# Patient Record
Sex: Female | Born: 1985 | Race: White | Hispanic: No | Marital: Single | Smoking: Never smoker
Health system: Southern US, Community
[De-identification: ages and names within clinical notes are randomized; demographics above are authoritative.]

## PROBLEM LIST (undated history)

## (undated) DIAGNOSIS — E119 Type 2 diabetes mellitus without complications: Secondary | ICD-10-CM

## (undated) DIAGNOSIS — K759 Inflammatory liver disease, unspecified: Secondary | ICD-10-CM

---

## 2015-11-23 ENCOUNTER — Ambulatory Visit (INDEPENDENT_AMBULATORY_CARE_PROVIDER_SITE_OTHER): Payer: BLUE CROSS/BLUE SHIELD | Admitting: Family Medicine

## 2015-11-23 VITALS — BP 108/88 | HR 104 | Temp 98.6°F | Resp 16 | Ht 71.0 in | Wt 156.6 lb

## 2015-11-23 DIAGNOSIS — J069 Acute upper respiratory infection, unspecified: Secondary | ICD-10-CM

## 2015-11-23 DIAGNOSIS — R059 Cough, unspecified: Secondary | ICD-10-CM

## 2015-11-23 DIAGNOSIS — R05 Cough: Secondary | ICD-10-CM | POA: Diagnosis not present

## 2015-11-23 MED ORDER — HYDROCODONE-HOMATROPINE 5-1.5 MG/5ML PO SYRP: ORAL_SOLUTION | ORAL | Status: DC

## 2015-11-23 NOTE — Patient Instructions (Signed)
Saline nasal spray as needed, over the counter mucinex or mucinex DM for cough during day, hydrocodone cough syrup at night if needed.  Fluids, and rest as needed.   Return to the clinic or go to the nearest emergency room if any of your symptoms worsen or new symptoms occur.   Upper Respiratory Infection, Adult Most upper respiratory infections (URIs) are a viral infection of the air passages leading to the lungs. A URI affects the nose, throat, and upper air passages. The most common type of URI is nasopharyngitis and is typically referred to as "the common cold." URIs run their course and usually go away on their own. Most of the time, a URI does not require medical attention, but sometimes a bacterial infection in the upper airways can follow a viral infection. This is called a secondary infection. Sinus and middle ear infections are common types of secondary upper respiratory infections. Bacterial pneumonia can also complicate a URI. A URI can worsen asthma and chronic obstructive pulmonary disease (COPD). Sometimes, these complications can require emergency medical care and may be life threatening.  CAUSES Almost all URIs are caused by viruses. A virus is a type of germ and can spread from one person to another.  RISKS FACTORS You may be at risk for a URI if:   You smoke.   You have chronic heart or lung disease.  You have a weakened defense (immune) system.   You are very young or very old.   You have nasal allergies or asthma.  You work in crowded or poorly ventilated areas.  You work in health care facilities or schools. SIGNS AND SYMPTOMS  Symptoms typically develop 2-3 days after you come in contact with a cold virus. Most viral URIs last 7-10 days. However, viral URIs from the influenza virus (flu virus) can last 14-18 days and are typically more severe. Symptoms may include:   Runny or stuffy (congested) nose.   Sneezing.   Cough.   Sore throat.   Headache.    Fatigue.   Fever.   Loss of appetite.   Pain in your forehead, behind your eyes, and over your cheekbones (sinus pain).  Muscle aches.  DIAGNOSIS  Your health care provider may diagnose a URI by:  Physical exam.  Tests to check that your symptoms are not due to another condition such as:  Strep throat.  Sinusitis.  Pneumonia.  Asthma. TREATMENT  A URI goes away on its own with time. It cannot be cured with medicines, but medicines may be prescribed or recommended to relieve symptoms. Medicines may help:  Reduce your fever.  Reduce your cough.  Relieve nasal congestion. HOME CARE INSTRUCTIONS   Take medicines only as directed by your health care provider.   Gargle warm saltwater or take cough drops to comfort your throat as directed by your health care provider.  Use a warm mist humidifier or inhale steam from a shower to increase air moisture. This may make it easier to breathe.  Drink enough fluid to keep your urine clear or pale yellow.   Eat soups and other clear broths and maintain good nutrition.   Rest as needed.   Return to work when your temperature has returned to normal or as your health care provider advises. You may need to stay home longer to avoid infecting others. You can also use a face mask and careful hand washing to prevent spread of the virus.  Increase the usage of your inhaler if you have  asthma.   Do not use any tobacco products, including cigarettes, chewing tobacco, or electronic cigarettes. If you need help quitting, ask your health care provider. PREVENTION  The best way to protect yourself from getting a cold is to practice good hygiene.   Avoid oral or hand contact with people with cold symptoms.   Wash your hands often if contact occurs.  There is no clear evidence that vitamin C, vitamin E, echinacea, or exercise reduces the chance of developing a cold. However, it is always recommended to get plenty of rest,  exercise, and practice good nutrition.  SEEK MEDICAL CARE IF:   You are getting worse rather than better.   Your symptoms are not controlled by medicine.   You have chills.  You have worsening shortness of breath.  You have brown or red mucus.  You have yellow or brown nasal discharge.  You have pain in your face, especially when you bend forward.  You have a fever.  You have swollen neck glands.  You have pain while swallowing.  You have white areas in the back of your throat. SEEK IMMEDIATE MEDICAL CARE IF:   You have severe or persistent:  Headache.  Ear pain.  Sinus pain.  Chest pain.  You have chronic lung disease and any of the following:  Wheezing.  Prolonged cough.  Coughing up blood.  A change in your usual mucus.  You have a stiff neck.  You have changes in your:  Vision.  Hearing.  Thinking.  Mood. MAKE SURE YOU:   Understand these instructions.  Will watch your condition.  Will get help right away if you are not doing well or get worse.   This information is not intended to replace advice given to you by your health care provider. Make sure you discuss any questions you have with your health care provider.   Document Released: 04/25/2001 Document Revised: 03/16/2015 Document Reviewed: 02/04/2014 Elsevier Interactive Patient Education 2016 Elsevier Inc.  Cough, Adult Coughing is a reflex that clears your throat and your airways. Coughing helps to heal and protect your lungs. It is normal to cough occasionally, but a cough that happens with other symptoms or lasts a long time may be a sign of a condition that needs treatment. A cough may last only 2-3 weeks (acute), or it may last longer than 8 weeks (chronic). CAUSES Coughing is commonly caused by:  Breathing in substances that irritate your lungs.  A viral or bacterial respiratory infection.  Allergies.  Asthma.  Postnasal drip.  Smoking.  Acid backing up from the  stomach into the esophagus (gastroesophageal reflux).  Certain medicines.  Chronic lung problems, including COPD (or rarely, lung cancer).  Other medical conditions such as heart failure. HOME CARE INSTRUCTIONS  Pay attention to any changes in your symptoms. Take these actions to help with your discomfort:  Take medicines only as told by your health care provider.  If you were prescribed an antibiotic medicine, take it as told by your health care provider. Do not stop taking the antibiotic even if you start to feel better.  Talk with your health care provider before you take a cough suppressant medicine.  Drink enough fluid to keep your urine clear or pale yellow.  If the air is dry, use a cold steam vaporizer or humidifier in your bedroom or your home to help loosen secretions.  Avoid anything that causes you to cough at work or at home.  If your cough is worse at  night, try sleeping in a semi-upright position.  Avoid cigarette smoke. If you smoke, quit smoking. If you need help quitting, ask your health care provider.  Avoid caffeine.  Avoid alcohol.  Rest as needed. SEEK MEDICAL CARE IF:   You have new symptoms.  You cough up pus.  Your cough does not get better after 2-3 weeks, or your cough gets worse.  You cannot control your cough with suppressant medicines and you are losing sleep.  You develop pain that is getting worse or pain that is not controlled with pain medicines.  You have a fever.  You have unexplained weight loss.  You have night sweats. SEEK IMMEDIATE MEDICAL CARE IF:  You cough up blood.  You have difficulty breathing.  Your heartbeat is very fast.   This information is not intended to replace advice given to you by your health care provider. Make sure you discuss any questions you have with your health care provider.   Document Released: 04/28/2011 Document Revised: 07/21/2015 Document Reviewed: 01/06/2015 Elsevier Interactive Patient  Education [employer]

## 2015-11-23 NOTE — Progress Notes (Signed)
Subjective:  By signing my name below, I, Linda [name], attest that this documentation has been prepared under the direction and in the presence of Meredith Staggers, MD. Electronically Signed: Stann [name], Scribe. 11/23/2015 , 12:35 PM .  Patient was seen in Room 11 .   Patient ID: Linda [name], female    DOB: 1986-10-02, 30 y.o.   MRN: [id number] Chief Complaint  Patient presents with  . Cough    x 5 days   . Generalized Body Aches   HPI Linda [name] is a 30 y.o. female Pt complains of cough that started about 5 days ago with general myalgia. She states that it started with dry cough leading to sore throat, congestion with myalgia and feeling lethargic. She's taken tylenol cold & flu, dayquil, and nyquil without any relief. She also mentions having a low grade fever (tmax 99.8) 2 days ago, but this has resolved. She notes that the coughs are keeping her up at night. She has some shortness of breath with activity. She denies wheezing, and chest pain.   Her boyfriend has some nasal congestion. She also has sick contact while at work (at [employer]).  She also works as a Leisure centre manager at night.   There are no active problems to display for this patient.  History reviewed. No pertinent past medical history. History reviewed. No pertinent past surgical history. No Known Allergies Prior to Admission medications   Medication Sig Start Date End Date Taking? Authorizing Provider  Norgestimate-Ethinyl Estradiol Triphasic (TRI-LO-SPRINTEC) 0.18/0.215/0.25 MG-25 MCG tab Take 1 tablet by mouth daily.   Yes Historical Provider, MD   Social History   Social History  . Marital Status: Single    Spouse Name: N/A  . Number of Children: N/A  . Years of Education: N/A   Occupational History  . Not on file.   Social History Main Topics  . Smoking status: Never Smoker   . Smokeless tobacco: Not on file  . Alcohol Use: 0.0 oz/week    0 Standard drinks or equivalent per week  . Drug Use: No    . Sexual Activity: Not on file   Other Topics Concern  . Not on file   Social History Narrative  . No narrative on file   Review of Systems  Constitutional: Positive for fatigue. Negative for fever and chills.  HENT: Positive for congestion and sore throat. Negative for rhinorrhea and sinus pressure.   Respiratory: Positive for cough and shortness of breath (with activity). Negative for wheezing.   Cardiovascular: Negative for chest pain.  Gastrointestinal: Negative for nausea and vomiting.  Musculoskeletal: Positive for myalgias (general).      Objective:   Physical Exam  Constitutional: She is oriented to person, place, and time. She appears well-developed and well-nourished. No distress.  HENT:  Head: Normocephalic and atraumatic.  Right Ear: Hearing, tympanic membrane, external ear and ear canal normal.  Left Ear: Hearing, tympanic membrane, external ear and ear canal normal.  Nose: Nose normal. Right sinus exhibits no maxillary sinus tenderness and no frontal sinus tenderness. Left sinus exhibits no maxillary sinus tenderness and no frontal sinus tenderness.  Mouth/Throat: Oropharynx is clear and moist. No oropharyngeal exudate.  Eyes: Conjunctivae and EOM are normal. Pupils are equal, round, and reactive to light.  Cardiovascular: Normal rate, regular rhythm, normal heart sounds and intact distal pulses.   No murmur heard. Pulmonary/Chest: Effort normal and breath sounds normal. No respiratory distress. She has no wheezes. She has no rhonchi.  Lymphadenopathy:    She has no cervical adenopathy.  Neurological: She is alert and oriented to person, place, and time.  Skin: Skin is warm and dry. No rash noted.  Psychiatric: She has a normal mood and affect. Her behavior is normal.  Vitals reviewed.   Filed Vitals:   11/23/15 1201  BP: 108/88  Pulse: 104  Temp: 98.6 F (37 C)  TempSrc: Oral  Resp: 16  Height: 5\' 11"  (1.803 m)  Weight: 156 lb 9.6 oz (71.033 kg)  SpO2:  98%      Assessment & Plan:   ISLAH EVE is a 30 y.o. female Cough - Plan: HYDROcodone-homatropine (HYCODAN) 5-1.5 MG/5ML syrup  Acute upper respiratory infection  -suspected viral URI.   -sx care discussed, mucinex during day, hycodan at night if needed - SED.   -note for work given, RTC precautions.   Meds ordered this encounter  Medications  . Norgestimate-Ethinyl Estradiol Triphasic (TRI-LO-SPRINTEC) 0.18/0.215/0.25 MG-25 MCG tab    Sig: Take 1 tablet by mouth daily.  Marland Kitchen HYDROcodone-homatropine (HYCODAN) 5-1.5 MG/5ML syrup    Sig: 1m by mouth a bedtime as needed for cough.    Dispense:  120 mL    Refill:  0   Patient Instructions  Saline nasal spray as needed, over the counter mucinex or mucinex DM for cough during day, hydrocodone cough syrup at night if needed.  Fluids, and rest as needed.   Return to the clinic or go to the nearest emergency room if any of your symptoms worsen or new symptoms occur.   Upper Respiratory Infection, Adult Most upper respiratory infections (URIs) are a viral infection of the air passages leading to the lungs. A URI affects the nose, throat, and upper air passages. The most common type of URI is nasopharyngitis and is typically referred to as "the common cold." URIs run their course and usually go away on their own. Most of the time, a URI does not require medical attention, but sometimes a bacterial infection in the upper airways can follow a viral infection. This is called a secondary infection. Sinus and middle ear infections are common types of secondary upper respiratory infections. Bacterial pneumonia can also complicate a URI. A URI can worsen asthma and chronic obstructive pulmonary disease (COPD). Sometimes, these complications can require emergency medical care and may be life threatening.  CAUSES Almost all URIs are caused by viruses. A virus is a type of germ and can spread from one person to another.  RISKS FACTORS You may be at  risk for a URI if:   You smoke.   You have chronic heart or lung disease.  You have a weakened defense (immune) system.   You are very young or very old.   You have nasal allergies or asthma.  You work in crowded or poorly ventilated areas.  You work in health care facilities or schools. SIGNS AND SYMPTOMS  Symptoms typically develop 2-3 days after you come in contact with a cold virus. Most viral URIs last 7-10 days. However, viral URIs from the influenza virus (flu virus) can last 14-18 days and are typically more severe. Symptoms may include:   Runny or stuffy (congested) nose.   Sneezing.   Cough.   Sore throat.   Headache.   Fatigue.   Fever.   Loss of appetite.   Pain in your forehead, behind your eyes, and over your cheekbones (sinus pain).  Muscle aches.  DIAGNOSIS  Your health care provider may diagnose a  URI by:  Physical exam.  Tests to check that your symptoms are not due to another condition such as:  Strep throat.  Sinusitis.  Pneumonia.  Asthma. TREATMENT  A URI goes away on its own with time. It cannot be cured with medicines, but medicines may be prescribed or recommended to relieve symptoms. Medicines may help:  Reduce your fever.  Reduce your cough.  Relieve nasal congestion. HOME CARE INSTRUCTIONS   Take medicines only as directed by your health care provider.   Gargle warm saltwater or take cough drops to comfort your throat as directed by your health care provider.  Use a warm mist humidifier or inhale steam from a shower to increase air moisture. This may make it easier to breathe.  Drink enough fluid to keep your urine clear or pale yellow.   Eat soups and other clear broths and maintain good nutrition.   Rest as needed.   Return to work when your temperature has returned to normal or as your health care provider advises. You may need to stay home longer to avoid infecting others. You can also use a face  mask and careful hand washing to prevent spread of the virus.  Increase the usage of your inhaler if you have asthma.   Do not use any tobacco products, including cigarettes, chewing tobacco, or electronic cigarettes. If you need help quitting, ask your health care provider. PREVENTION  The best way to protect yourself from getting a cold is to practice good hygiene.   Avoid oral or hand contact with people with cold symptoms.   Wash your hands often if contact occurs.  There is no clear evidence that vitamin C, vitamin E, echinacea, or exercise reduces the chance of developing a cold. However, it is always recommended to get plenty of rest, exercise, and practice good nutrition.  SEEK MEDICAL CARE IF:   You are getting worse rather than better.   Your symptoms are not controlled by medicine.   You have chills.  You have worsening shortness of breath.  You have brown or red mucus.  You have yellow or brown nasal discharge.  You have pain in your face, especially when you bend forward.  You have a fever.  You have swollen neck glands.  You have pain while swallowing.  You have white areas in the back of your throat. SEEK IMMEDIATE MEDICAL CARE IF:   You have severe or persistent:  Headache.  Ear pain.  Sinus pain.  Chest pain.  You have chronic lung disease and any of the following:  Wheezing.  Prolonged cough.  Coughing up blood.  A change in your usual mucus.  You have a stiff neck.  You have changes in your:  Vision.  Hearing.  Thinking.  Mood. MAKE SURE YOU:   Understand these instructions.  Will watch your condition.  Will get help right away if you are not doing well or get worse.   This information is not intended to replace advice given to you by your health care provider. Make sure you discuss any questions you have with your health care provider.   Document Released: 04/25/2001 Document Revised: 03/16/2015 Document Reviewed:  02/04/2014 Elsevier Interactive Patient Education 2016 Else[employer] Inc.  Cough, Adult Coughing is a reflex that clears your throat and your airways. Coughing helps to heal and protect your lungs. It is normal to cough occasionally, but a cough that happens with other symptoms or lasts a long time may be a sign of  a condition that needs treatment. A cough may last only 2-3 weeks (acute), or it may last longer than 8 weeks (chronic). CAUSES Coughing is commonly caused by:  Breathing in substances that irritate your lungs.  A viral or bacterial respiratory infection.  Allergies.  Asthma.  Postnasal drip.  Smoking.  Acid backing up from the stomach into the esophagus (gastroesophageal reflux).  Certain medicines.  Chronic lung problems, including COPD (or rarely, lung cancer).  Other medical conditions such as heart failure. HOME CARE INSTRUCTIONS  Pay attention to any changes in your symptoms. Take these actions to help with your discomfort:  Take medicines only as told by your health care provider.  If you were prescribed an antibiotic medicine, take it as told by your health care provider. Do not stop taking the antibiotic even if you start to feel better.  Talk with your health care provider before you take a cough suppressant medicine.  Drink enough fluid to keep your urine clear or pale yellow.  If the air is dry, use a cold steam vaporizer or humidifier in your bedroom or your home to help loosen secretions.  Avoid anything that causes you to cough at work or at home.  If your cough is worse at night, try sleeping in a semi-upright position.  Avoid cigarette smoke. If you smoke, quit smoking. If you need help quitting, ask your health care provider.  Avoid caffeine.  Avoid alcohol.  Rest as needed. SEEK MEDICAL CARE IF:   You have new symptoms.  You cough up pus.  Your cough does not get better after 2-3 weeks, or your cough gets worse.  You cannot control  your cough with suppressant medicines and you are losing sleep.  You develop pain that is getting worse or pain that is not controlled with pain medicines.  You have a fever.  You have unexplained weight loss.  You have night sweats. SEEK IMMEDIATE MEDICAL CARE IF:  You cough up blood.  You have difficulty breathing.  Your heartbeat is very fast.   This information is not intended to replace advice given to you by your health care provider. Make sure you discuss any questions you have with your health care provider.   Document Released: 04/28/2011 Document Revised: 07/21/2015 Document Reviewed: 01/06/2015 Elsevier Interactive Patient Education [employer]     I personally performed the services described in this documentation, which was scribed in my presence. The recorded information has been reviewed and considered, and addended by me as needed.

## 2015-12-10 ENCOUNTER — Ambulatory Visit (INDEPENDENT_AMBULATORY_CARE_PROVIDER_SITE_OTHER): Payer: BLUE CROSS/BLUE SHIELD | Admitting: Family Medicine

## 2015-12-10 VITALS — BP 98/62 | HR 77 | Temp 98.4°F | Resp 16 | Ht 70.0 in | Wt 155.4 lb

## 2015-12-10 DIAGNOSIS — H109 Unspecified conjunctivitis: Secondary | ICD-10-CM

## 2015-12-10 MED ORDER — POLYMYXIN B-TRIMETHOPRIM 10000-0.1 UNIT/ML-% OP SOLN: 1.0000 [drp] | OPHTHALMIC | Status: DC

## 2015-12-10 NOTE — Progress Notes (Signed)
[facility] [address], [place] [address] [phone]  Date:  12/10/2015   Name:  TARSHA [name]   DOB:  05-23-1986   MRN:  [id number]  PCP:  No PCP Per Patient    Chief Complaint: Eye Pain   History of Present Illness:  Linda [name] is a 30 y.o. very pleasant female patient who presents with the following:  Established pt here today with complaint of a right eye problems. She noted onset of sx yesterday- her eye was red and bloodshot. Overnight it got worse.  The eye is not painful.  It is "uncomfortable" but not painful. It was gooey this am.   Her vision is ok excpet for matter in the eye.   She does not wear contacts. She does use glasses for driving She is generally in good health.   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   NKDA.    There are no active problems to display for this patient.   History reviewed. No pertinent past medical history.  History reviewed. No pertinent past surgical history.  Social History  Substance Use Topics  . Smoking status: Never Smoker   . Smokeless tobacco: None  . Alcohol Use: 0.0 oz/week    0 Standard drinks or equivalent per week    Family History  Problem Relation Age of Onset  . Diabetes Sister     No Known Allergies  Medication list has been reviewed and updated.  Current Outpatient Prescriptions on File Prior to Visit  Medication Sig Dispense Refill  . Norgestimate-Ethinyl Estradiol Triphasic (TRI-LO-SPRINTEC) 0.18/0.215/0.25 MG-25 MCG tab Take 1 tablet by mouth daily.     No current facility-administered medications on file prior to visit.    Review of Systems:  As per HPI- otherwise negative.   Physical Examination: Filed Vitals:   12/10/15 1054  BP: 98/62  Pulse: 77  Temp: 98.4 F (36.9 C)  Resp: 16   Filed Vitals:   12/10/15 1054  Height:  (1.778 m)  Weight: 155 lb 6.4 oz (70.489 kg)   Body mass index is 22.3  kg/(m^2). Ideal Body Weight: Weight in (lb) to have BMI = 25: 173.9  GEN: WDWN, NAD, Non-toxic, A & O x 3, looks well except for right eye  HEENT: Atraumatic, Normocephalic. Neck supple. No masses, No LAD.  Bilateral TM wnl, oropharynx normal.  PEERL,EOMI.   Ears and Nose: No external deformity. CV: RRR, No M/G/R. No JVD. No thrill. No extra heart sounds. PULM: CTA B, no wheezes, crackles, rhonchi. No retractions. No resp. distress. No accessory muscle use. EXTR: No c/c/e NEURO Normal gait.  PSYCH: Normally interactive. Conversant. Not depressed or anxious appearing.  Calm demeanor.  Right eye: conjunctivae are injected and red.  Upper lid is slightly swollen.  No pain with movement of eye.  Normal limited fundoscopic exam  Negative fluorescin stain  Assessment and Plan: Conjunctivitis of right eye - Plan: trimethoprim-polymyxin b (POLYTRIM) ophthalmic solution  Treat with polytrim.  Follow-up if not better soon  Signed Abbe Amsterdam, MD

## 2015-12-10 NOTE — Patient Instructions (Signed)
You have pinkeye (conjunctivitis). This is a common eye infection Use the eye drops as directed.  Let me know if your eye is not better in the next 2 days- Sooner if worse.   Remember that this is quite contagious!  Wash your hands after touching your eyes and wipe down communal phones, etc after use

## 2015-12-17 ENCOUNTER — Encounter: Payer: Self-pay | Admitting: Physician Assistant

## 2015-12-17 ENCOUNTER — Ambulatory Visit (INDEPENDENT_AMBULATORY_CARE_PROVIDER_SITE_OTHER): Payer: BLUE CROSS/BLUE SHIELD | Admitting: Physician Assistant

## 2015-12-17 VITALS — BP 120/78 | HR 81 | Temp 98.5°F | Resp 17 | Ht 71.0 in | Wt 159.0 lb

## 2015-12-17 DIAGNOSIS — J029 Acute pharyngitis, unspecified: Secondary | ICD-10-CM | POA: Diagnosis not present

## 2015-12-17 LAB — POCT CBC
Granulocyte percent: 64.3 %G (ref 37–80)
HCT, POC: 42.1 % (ref 37.7–47.9)
Hemoglobin: 14.5 g/dL (ref 12.2–16.2)
Lymph, poc: 1.7 (ref 0.6–3.4)
MCH, POC: 30.8 pg (ref 27–31.2)
MCHC: 34.5 g/dL (ref 31.8–35.4)
MCV: 89.4 fL (ref 80–97)
MID (cbc): 0.3 (ref 0–0.9)
MPV: 8.4 fL (ref 0–99.8)
POC Granulocyte: 3.6 (ref 2–6.9)
POC LYMPH PERCENT: 29.7 %L (ref 10–50)
POC MID %: 6 %M (ref 0–12)
Platelet Count, POC: 184 10*3/uL (ref 142–424)
RBC: 4.71 M/uL (ref 4.04–5.48)
RDW, POC: 13.4 %
WBC: 5.6 10*3/uL (ref 4.6–10.2)

## 2015-12-17 LAB — POCT RAPID STREP A (OFFICE): Rapid Strep A Screen: NEGATIVE

## 2015-12-17 MED ORDER — BENZOCAINE (TOPICAL) 20 % EX AERO: 1.0000 "application " | INHALATION_SPRAY | Freq: Four times a day (QID) | CUTANEOUS | Status: DC | PRN

## 2015-12-17 MED ORDER — KETOROLAC TROMETHAMINE 60 MG/2ML IM SOLN
60.0000 mg | Freq: Once | INTRAMUSCULAR | Status: AC
  Administered 2015-12-17: 60 mg via INTRAMUSCULAR

## 2015-12-17 NOTE — Patient Instructions (Signed)
Take 800 mg of Ibuprofen every 8 hours.  Take 1000 mg of tylenol every 8 hours.

## 2015-12-17 NOTE — Progress Notes (Signed)
12/17/2015 12:34 PM   DOB: July 09, 1986 / MRN: [id number]  SUBJECTIVE:  Linda [name] is a 30 y.o. female presenting for sore thorat that started in that last week.  Associates laryngitis.  She has tried several OTC medications with some relief. She feels this problem is getting worse.    She has No Known Allergies.   She  has no past medical history on file.    She  reports that she has never smoked. She does not have any smokeless tobacco history on file. She reports that she drinks alcohol. She reports that she does not use illicit drugs. She  has no sexual activity history on file. The patient  has no past surgical history on file.  Her family history includes Diabetes in her sister.  Review of Systems  Constitutional: Negative for fever and chills.  Eyes: Negative for blurred vision.  Respiratory: Negative for cough and shortness of breath.   Cardiovascular: Negative for chest pain.  Gastrointestinal: Negative for nausea and abdominal pain.  Genitourinary: Negative for dysuria, urgency and frequency.  Musculoskeletal: Negative for myalgias.  Skin: Negative for rash.  Neurological: Positive for dizziness. Negative for tingling and headaches.  Psychiatric/Behavioral: Negative for depression. The patient is not nervous/anxious.     Problem list and medications reviewed and updated by myself where necessary, and exist elsewhere in the encounter.   OBJECTIVE:  BP 120/78 mmHg  Pulse 81  Temp(Src) 98.5 F (36.9 C) (Oral)  Resp 17  Ht  (1.803 m)  Wt 159 lb (72.122 kg)  BMI 22.19 kg/m2  SpO2 98%  LMP 12/17/2015  Physical Exam  Constitutional: She is oriented to person, place, and time. She appears well-developed.  Eyes: EOM are normal. Pupils are equal, round, and reactive to light.  Cardiovascular: Normal rate.   Pulmonary/Chest: Effort normal.  Abdominal: She exhibits no distension.  Musculoskeletal: Normal range of motion.  Neurological: She is alert and oriented to  person, place, and time. No cranial nerve deficit.  Skin: Skin is warm and dry. She is not diaphoretic.  Psychiatric: She has a normal mood and affect.  Vitals reviewed.   Results for orders placed or performed in visit on 12/17/15 (from the past 72 hour(s))  POCT CBC     Status: None   Collection Time: 12/17/15 12:20 PM  Result Value Ref Range   WBC 5.6 4.6 - 10.2 K/uL   Lymph, poc 1.7 0.6 - 3.4   POC LYMPH PERCENT 29.7 10 - 50 %L   MID (cbc) 0.3 0 - 0.9   POC MID % 6.0 0 - 12 %M   POC Granulocyte 3.6 2 - 6.9   Granulocyte percent 64.3 37 - 80 %G   RBC 4.71 4.04 - 5.48 M/uL   Hemoglobin 14.5 12.2 - 16.2 g/dL   HCT, POC 40.9 81.1 - 47.9 %   MCV 89.4 80 - 97 fL   MCH, POC 30.8 27 - 31.2 pg   MCHC 34.5 31.8 - 35.4 g/dL   RDW, POC 91.4 %   Platelet Count, POC 184 142 - 424 K/uL   MPV 8.4 0 - 99.8 fL  POCT rapid strep A     Status: None   Collection Time: 12/17/15 12:23 PM  Result Value Ref Range   Rapid Strep A Screen Negative Negative    No results found.  ASSESSMENT AND PLAN  Jelena was seen today for sore throat and laryngitis.  Diagnoses and all orders for this visit:  Sore throat: Most likely viral.  Will treat to that end.  Advised she schedule pain meds per AVS.  Benzocaine prn.   -     POCT rapid strep A -     POCT CBC -     Culture, Group A Strep -     ketorolac (TORADOL) injection 60 mg; Inject 2 mLs (60 mg total) into the muscle once.  Other orders -     benzocaine (HURRICAINE) 20 % oral spray; Use as directed 1 application in the mouth or throat 4 (four) times daily as needed for throat irritation / pain.    The patient was advised to call or return to clinic if she does not see an improvement in symptoms or to seek the care of the closest emergency department if she worsens with the above plan.   Deliah Boston, MHS, PA-C [facility] 12/17/2015 12:34 PM

## 2015-12-18 ENCOUNTER — Other Ambulatory Visit: Payer: Self-pay | Admitting: Physician Assistant

## 2015-12-18 ENCOUNTER — Telehealth: Payer: Self-pay

## 2015-12-18 MED ORDER — HYDROCODONE-HOMATROPINE 5-1.5 MG/5ML PO SYRP: 2.5000 mL | ORAL_SOLUTION | Freq: Every evening | ORAL | Status: DC | PRN

## 2015-12-18 NOTE — Telephone Encounter (Signed)
Called patient and informed her to come pick up Rx

## 2015-12-18 NOTE — Telephone Encounter (Signed)
Attention: Linda [name], Pt was seen yesterday 12/17/15 by Linda [name] for Sore throat - Primary. Since she has left she has been coughing non-stop. She would like to be prescribed something for her cough. Pharmacy:  [facility] [address] [place] - [address] CB # [phone]

## 2015-12-19 LAB — CULTURE, GROUP A STREP: Organism ID, Bacteria: NORMAL

## 2016-11-23 ENCOUNTER — Ambulatory Visit (INDEPENDENT_AMBULATORY_CARE_PROVIDER_SITE_OTHER): Payer: BLUE CROSS/BLUE SHIELD | Admitting: Physician Assistant

## 2016-11-23 ENCOUNTER — Ambulatory Visit (INDEPENDENT_AMBULATORY_CARE_PROVIDER_SITE_OTHER): Payer: BLUE CROSS/BLUE SHIELD

## 2016-11-23 VITALS — BP 112/76 | HR 94 | Temp 98.4°F | Resp 16 | Ht 71.0 in | Wt 159.0 lb

## 2016-11-23 DIAGNOSIS — R05 Cough: Secondary | ICD-10-CM

## 2016-11-23 DIAGNOSIS — R69 Illness, unspecified: Secondary | ICD-10-CM | POA: Diagnosis not present

## 2016-11-23 DIAGNOSIS — R059 Cough, unspecified: Secondary | ICD-10-CM

## 2016-11-23 DIAGNOSIS — R509 Fever, unspecified: Secondary | ICD-10-CM | POA: Diagnosis not present

## 2016-11-23 DIAGNOSIS — J111 Influenza due to unidentified influenza virus with other respiratory manifestations: Secondary | ICD-10-CM

## 2016-11-23 MED ORDER — OSELTAMIVIR PHOSPHATE 75 MG PO CAPS: 75.0000 mg | ORAL_CAPSULE | Freq: Two times a day (BID) | ORAL | 0 refills | Status: DC

## 2016-11-23 MED ORDER — HYDROCODONE-HOMATROPINE 5-1.5 MG/5ML PO SYRP: 2.5000 mL | ORAL_SOLUTION | Freq: Two times a day (BID) | ORAL | 0 refills | Status: DC

## 2016-11-23 NOTE — Progress Notes (Signed)
  11/26/2016 8:42 AM   DOB: 07/04/1986 / MRN: [id number]  SUBJECTIVE:  Linda [name] is a 31 y.o. female presenting for flu like symptoms.  Reports her mother was diagnosed with flu. Patient complains mostly of cough that started around noon yesterday and she can not sleep due to the cough.  She also complains of back aching,  Myalgia, HA, and overwhelming fatigue. She is drinking copious fluids.  Denies a history of asthma and diabetes. She has been taking Dayquil and Nyquil along with Alka seltzer and this has been helping her symptoms.   She has No Known Allergies.   She  has no past medical history on file.    She  reports that she has never smoked. She does not have any smokeless tobacco history on file. She reports that she drinks alcohol. She reports that she does not use drugs. She  has no sexual activity history on file. The patient  has no past surgical history on file.  Her family history includes Diabetes in her sister.  Review of Systems  Constitutional: Positive for chills, fever and malaise/fatigue. Negative for diaphoresis.  Respiratory: Positive for cough. Negative for hemoptysis, sputum production, shortness of breath and wheezing.   Cardiovascular: Negative for chest pain and palpitations.  Gastrointestinal: Negative for nausea.  Skin: Negative for itching and rash.  Neurological: Negative for dizziness.    The problem list and medications were reviewed and updated by myself where necessary and exist elsewhere in the encounter.   OBJECTIVE:  BP 112/76   Pulse 94   Temp 98.4 F (36.9 C) (Oral)   Resp 16   Ht 5\' 11"  (1.803 m)   Wt 159 lb (72.1 kg)   LMP 11/17/2016   SpO2 96%   BMI 22.18 kg/m   Pulse Readings from Last 3 Encounters:  11/23/16 94  12/17/15 81  12/10/15 77     Physical Exam  Constitutional: She is oriented to person, place, and time. Vital signs are normal.  Non-toxic appearance. She does not have a sickly appearance. She appears ill. No  distress.  HENT:  Mouth/Throat: No oropharyngeal exudate.  Cardiovascular: Normal rate and regular rhythm.   Pulmonary/Chest: Effort normal and breath sounds normal. She has no wheezes. She has no rales.  Musculoskeletal: Normal range of motion.  Neurological: She is alert and oriented to person, place, and time.  Skin: Skin is warm and dry.  Psychiatric: She has a normal mood and affect.     ASSESSMENT AND PLAN:  Linda [name] was seen today for cough, fatigue and generalized body aches.  Diagnoses and all orders for this visit:  Cough: Rads clear.  Her symtpoms are consistent with the flu. Exposed to mother with suspected flu.  We are well with 48 hours thus tamiflu should provide some relief.  -     HYDROcodone-homatropine (HYCODAN) 5-1.5 MG/5ML syrup; Take 2.5-5 mLs by mouth 2 (two) times daily.  Fever, unspecified fever cause -     DG Chest 2 View; Future  Influenza-like illness -     oseltamivir (TAMIFLU) 75 MG capsule; Take 1 capsule (75 mg total) by mouth 2 (two) times daily.    The patient is advised to call or return to clinic if she does not see an improvement in symptoms, or to seek the care of the closest emergency department if she worsens with the above plan.   Linda [name], MHS, PA-C [facility] 11/26/2016 8:42 AM

## 2016-11-23 NOTE — Patient Instructions (Addendum)
Take Ibuprofen 600 mg every 6-8 hours as needed.     IF you received an x-ray today, you will receive an invoice from [facility]. Please contact [facility] at [phone] with questions or concerns regarding your invoice.   IF you received labwork today, you will receive an invoice from [facility]. Please contact [facility] at [phone] with questions or concerns regarding your invoice.   Our billing staff will not be able to assist you with questions regarding bills from these companies.  You will be contacted with the lab results as soon as they are available. The fastest way to get your results is to activate your My Chart account. Instructions are located on the last page of this paperwork. If you have not heard from us regarding the results in 2 weeks, please contact this office.

## 2016-11-28 ENCOUNTER — Other Ambulatory Visit: Payer: Self-pay | Admitting: Physician Assistant

## 2016-11-28 ENCOUNTER — Telehealth: Payer: Self-pay | Admitting: *Deleted

## 2016-11-28 DIAGNOSIS — R059 Cough, unspecified: Secondary | ICD-10-CM

## 2016-11-28 DIAGNOSIS — R05 Cough: Secondary | ICD-10-CM

## 2016-11-28 MED ORDER — HYDROCODONE-HOMATROPINE 5-1.5 MG/5ML PO SYRP: 2.5000 mL | ORAL_SOLUTION | Freq: Two times a day (BID) | ORAL | 0 refills | Status: DC

## 2016-11-28 NOTE — Telephone Encounter (Signed)
Patient called requesting another refill on the cough medication she was prescribed when she was here last week. Patient states she is still having the cough and is unable to sleep. Please advise [phone]. [facility] [place]

## 2016-11-29 ENCOUNTER — Other Ambulatory Visit: Payer: Self-pay

## 2016-11-29 NOTE — Telephone Encounter (Signed)
Cough med ordered yesterday called to [facility] [address]

## 2016-11-29 NOTE — Telephone Encounter (Signed)
Cough med refilled by Deliah Bostonmichael clark 11/28/16, I called it in

## 2017-04-16 ENCOUNTER — Ambulatory Visit (HOSPITAL_COMMUNITY)
Admission: EM | Admit: 2017-04-16 | Discharge: 2017-04-16 | Disposition: A | Discharge status: Home / Self Care | Payer: BLUE CROSS/BLUE SHIELD | Attending: Emergency Medicine | Admitting: Emergency Medicine

## 2017-04-16 ENCOUNTER — Encounter (HOSPITAL_COMMUNITY): Payer: Self-pay | Admitting: Emergency Medicine

## 2017-04-16 DIAGNOSIS — J0120 Acute ethmoidal sinusitis, unspecified: Secondary | ICD-10-CM | POA: Diagnosis not present

## 2017-04-16 MED ORDER — BENZONATATE 100 MG PO CAPS: 100.0000 mg | ORAL_CAPSULE | Freq: Three times a day (TID) | ORAL | 0 refills | Status: DC

## 2017-04-16 MED ORDER — PROMETHAZINE-DM 6.25-15 MG/5ML PO SYRP: 5.0000 mL | ORAL_SOLUTION | Freq: Four times a day (QID) | ORAL | 0 refills | Status: DC | PRN

## 2017-04-16 MED ORDER — AMOXICILLIN 500 MG PO CAPS: 500.0000 mg | ORAL_CAPSULE | Freq: Three times a day (TID) | ORAL | 0 refills | Status: DC

## 2017-04-16 NOTE — ED Triage Notes (Signed)
The patient presented to the [facility] with a complaint of a cough and congestion x 1 week. 

## 2017-04-16 NOTE — Discharge Instructions (Signed)
Return if any problems.

## 2017-04-16 NOTE — ED Provider Notes (Signed)
CSN: [id number]     Arrival date & time 04/16/17  1634 History   None    Chief Complaint  Patient presents with  . Cough   (Consider location/radiation/quality/duration/timing/severity/associated sxs/prior Treatment) The history is provided by the patient. No language interpreter was used.  Cough  Cough characteristics:  Productive Sputum characteristics:  Nondescript Severity:  Moderate Onset quality:  Gradual Duration:  1 week Timing:  Constant Progression:  Worsening Chronicity:  New Smoker: no   Relieved by:  Nothing Worsened by:  Nothing Ineffective treatments:  None tried Associated symptoms: no shortness of breath   Pt complains of cough and congestion.  Pt reports she has sinus congestion, pressure and ear pain.  Pt reports she is coughing up phelgm  History reviewed. No pertinent past medical history. History reviewed. No pertinent surgical history. Family History  Problem Relation Age of Onset  . Diabetes Sister    Social History  Substance Use Topics  . Smoking status: Never Smoker  . Smokeless tobacco: Not on file  . Alcohol use 0.0 oz/week   OB History    No data available     Review of Systems  Respiratory: Positive for cough. Negative for shortness of breath.   All other systems reviewed and are negative.   Allergies  Patient has no known allergies.  Home Medications   Prior to Admission medications   Medication Sig Start Date End Date Taking? Authorizing Provider  Norgestimate-Ethinyl Estradiol Triphasic (TRI-LO-SPRINTEC) 0.18/0.215/0.25 MG-25 MCG tab Take 1 tablet by mouth daily.   Yes [provider]  amoxicillin (AMOXIL) 500 MG capsule Take 1 capsule (500 mg total) by mouth 3 (three) times daily. 04/16/17   Elson AreasSofia, Leslie K, PA-C  benzonatate (TESSALON) 100 MG capsule Take 1 capsule (100 mg total) by mouth every 8 (eight) hours. 04/16/17   Elson AreasSofia, Leslie K, PA-C   Meds Ordered and Administered this Visit  Medications - No data to  display  BP 119/68 (BP Location: Right Arm)   Pulse 84   Temp 98.6 F (37 C) (Oral)   Resp 18   SpO2 98%  No data found.   Physical Exam  Constitutional: She appears well-developed and well-nourished. No distress.  HENT:  Head: Normocephalic and atraumatic.  Eyes: Conjunctivae are normal.  Neck: Neck supple.  Cardiovascular: Normal rate and regular rhythm.   No murmur heard. Pulmonary/Chest: Effort normal and breath sounds normal. No respiratory distress.  Abdominal: Soft. There is no tenderness.  Musculoskeletal: She exhibits no edema.  Neurological: She is alert.  Skin: Skin is warm and dry.  Psychiatric: She has a normal mood and affect.  Nursing note and vitals reviewed.   Urgent Care Course     Procedures (including critical care time)  Labs Review Labs Reviewed - No data to display  Imaging Review No results found.   Visual Acuity Review  Right Eye Distance:   Left Eye Distance:   Bilateral Distance:    Right Eye Near:   Left Eye Near:    Bilateral Near:         MDM    1. Acute ethmoidal sinusitis, recurrence not specified    An After Visit Summary was printed and given to the patient. Meds ordered this encounter  Medications  . amoxicillin (AMOXIL) 500 MG capsule    Sig: Take 1 capsule (500 mg total) by mouth 3 (three) times daily.    Dispense:  30 capsule    Refill:  0    Order Specific  Question:   Supervising Provider    Answer:   Domenick Gong [[id number]]  . benzonatate (TESSALON) 100 MG capsule    Sig: Take 1 capsule (100 mg total) by mouth every 8 (eight) hours.    Dispense:  21 capsule    Refill:  0    Order Specific Question:   Supervising Provider    Answer:   Domenick Gong [[id number]]      Elson Areas, PA-C 04/16/17 1843

## 2017-04-18 ENCOUNTER — Telehealth (HOSPITAL_COMMUNITY): Payer: Self-pay | Admitting: Emergency Medicine

## 2017-04-18 NOTE — Telephone Encounter (Signed)
Patient called reporting cough medicine is not helping.  Spoke to Regions Financial Corporationbill oxford, np about patient's issue.  Called in brom fed dm 10 ml po q 6 hours as needed for cough.  Quantity 240 ml, no refill.  Prescribed by bill oxford, np.

## 2017-07-07 ENCOUNTER — Ambulatory Visit (INDEPENDENT_AMBULATORY_CARE_PROVIDER_SITE_OTHER): Payer: 59

## 2017-07-07 ENCOUNTER — Ambulatory Visit (INDEPENDENT_AMBULATORY_CARE_PROVIDER_SITE_OTHER): Payer: 59 | Admitting: Family Medicine

## 2017-07-07 ENCOUNTER — Encounter: Payer: Self-pay | Admitting: Family Medicine

## 2017-07-07 VITALS — BP 122/84 | HR 85 | Temp 99.1°F | Resp 16 | Ht 70.0 in | Wt 158.6 lb

## 2017-07-07 DIAGNOSIS — W19XXXA Unspecified fall, initial encounter: Secondary | ICD-10-CM | POA: Diagnosis not present

## 2017-07-07 DIAGNOSIS — S6992XA Unspecified injury of left wrist, hand and finger(s), initial encounter: Secondary | ICD-10-CM | POA: Diagnosis not present

## 2017-07-07 DIAGNOSIS — M79642 Pain in left hand: Secondary | ICD-10-CM

## 2017-07-07 NOTE — Patient Instructions (Addendum)
     IF you received an x-ray today, you will receive an invoice from [facility]. Please contact [facility] at [phone] with questions or concerns regarding your invoice.   IF you received labwork today, you will receive an invoice from [facility]. Please contact [facility] at [phone] with questions or concerns regarding your invoice.   Our billing staff will not be able to assist you with questions regarding bills from these companies.  You will be contacted with the lab results as soon as they are available. The fastest way to get your results is to activate your My Chart account. Instructions are located on the last page of this paperwork. If you have not heard from us regarding the results in 2 weeks, please contact this office.     Musculoskeletal Pain Musculoskeletal pain is muscle and bone aches and pains. This pain can occur in any part of the body. Follow these instructions at home:  Only take medicines for pain, discomfort, or fever as told by your health care provider.  You may continue all activities unless the activities cause more pain. When the pain lessens, slowly resume normal activities. Gradually increase the intensity and duration of the activities or exercise.  During periods of severe pain, bed rest may be helpful. Lie or sit in any position that is comfortable, but get out of bed and walk around at least every several hours.  If directed, put ice on the injured area. ? Put ice in a plastic bag. ? Place a towel between your skin and the bag. ? Leave the ice on for 20 minutes, 2-3 times a day. Contact a health care provider if:  Your pain is getting worse.  Your pain is not relieved with medicines.  You lose function in the area of the pain if the pain is in your arms, legs, or neck. This information is not intended to replace advice given to you by your health care provider. Make sure you discuss any questions you have with your health  care provider. Document Released: 10/30/2005 Document Revised: 04/11/2016 Document Reviewed: 07/04/2013 Elsevier Interactive Patient Education  2017 Elsevier Inc.  
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
## 2017-08-23 ENCOUNTER — Encounter: Payer: Self-pay | Admitting: Physician Assistant

## 2017-08-23 ENCOUNTER — Ambulatory Visit (INDEPENDENT_AMBULATORY_CARE_PROVIDER_SITE_OTHER): Payer: 59 | Admitting: Physician Assistant

## 2017-08-23 VITALS — BP 106/70 | HR 89 | Temp 98.9°F | Resp 16 | Ht 70.0 in | Wt 159.8 lb

## 2017-08-23 DIAGNOSIS — J069 Acute upper respiratory infection, unspecified: Secondary | ICD-10-CM

## 2017-08-23 DIAGNOSIS — J029 Acute pharyngitis, unspecified: Secondary | ICD-10-CM | POA: Diagnosis not present

## 2017-08-23 LAB — POCT RAPID STREP A (OFFICE): Rapid Strep A Screen: NEGATIVE

## 2017-08-23 MED ORDER — HYDROCODONE-HOMATROPINE 5-1.5 MG/5ML PO SYRP: 2.5000 mL | ORAL_SOLUTION | Freq: Every evening | ORAL | 0 refills | Status: DC | PRN

## 2017-08-23 MED ORDER — PSEUDOEPHEDRINE-NAPROXEN NA ER 120-220 MG PO TB12: ORAL_TABLET | ORAL | 0 refills | Status: DC

## 2017-08-23 NOTE — Progress Notes (Signed)
08/23/2017 11:20 AM   DOB: [birth year] / MRN: [id number]  SUBJECTIVE:  Linda [name] is a 31 y.o. female presenting for cough, sore throat, and nasal congestion that started about three days ago.  No SOB or chest pain.  Taking several OTC meds and these have been helping.   She has No Known Allergies.   She  has no past medical history on file.    She  reports that she has never smoked. She has never used smokeless tobacco. She reports that she drinks alcohol. She reports that she does not use drugs. She  has no sexual activity history on file. The patient  has no past surgical history on file.  Her family history includes Diabetes in her sister.  Review of Systems  Constitutional: Negative for chills, diaphoresis and fever.  Respiratory: Negative for cough, hemoptysis, sputum production, shortness of breath and wheezing.   Cardiovascular: Negative for chest pain, orthopnea and leg swelling.  Gastrointestinal: Negative for abdominal pain, blood in stool, constipation, diarrhea, heartburn, melena, nausea and vomiting.  Genitourinary: Negative for flank pain.  Skin: Negative for rash.  Neurological: Negative for dizziness.    The problem list and medications were reviewed and updated by myself where necessary and exist elsewhere in the encounter.   OBJECTIVE:  BP 106/70 (BP Location: Left Arm, Patient Position: Sitting, Cuff Size: Normal)   Pulse 89   Temp 98.9 F (37.2 C) (Oral)   Resp 16   Ht  (1.778 m)   Wt 159 lb 12.8 oz (72.5 kg)   LMP 08/18/2017   SpO2 97%   BMI 22.93 kg/m   Physical Exam  Constitutional: She is oriented to person, place, and time. She is active.  Non-toxic appearance.  HENT:  Right Ear: Hearing, tympanic membrane, external ear and ear canal normal.  Left Ear: Hearing, tympanic membrane, external ear and ear canal normal.  Nose: Nose normal. Right sinus exhibits no maxillary sinus tenderness and no frontal sinus tenderness. Left sinus exhibits  no maxillary sinus tenderness and no frontal sinus tenderness.  Mouth/Throat: Uvula is midline, oropharynx is clear and moist and mucous membranes are normal. Mucous membranes are not dry. No oropharyngeal exudate, posterior oropharyngeal edema or tonsillar abscesses.  Cardiovascular: Normal rate, regular rhythm, S1 normal, S2 normal, normal heart sounds and intact distal pulses.  Exam reveals no gallop, no friction rub and no decreased pulses.   No murmur heard. Pulmonary/Chest: Effort normal. No stridor. No tachypnea. No respiratory distress. She has no wheezes. She has no rales.  Abdominal: She exhibits no distension.  Musculoskeletal: She exhibits no edema.  Lymphadenopathy:       Head (right side): No submandibular and no tonsillar adenopathy present.       Head (left side): No submandibular and no tonsillar adenopathy present.    She has no cervical adenopathy.  Neurological: She is alert and oriented to person, place, and time.  Skin: Skin is warm and dry. She is not diaphoretic. No pallor.    No results found for this or any previous visit (from the past 72 hour(s)).  No results found.  ASSESSMENT AND PLAN:  Marleena was seen today for cough.  Diagnoses and all orders for this visit:  Sore throat -     Culture, Group A Strep -     POCT rapid strep A  Acute URI: With cough.  -     HYDROcodone-homatropine (HYCODAN) 5-1.5 MG/5ML syrup; Take 2.5-5 mLs by mouth at bedtime  as needed. -     Pseudoephedrine-Naproxen Na (ALEVE-D SINUS & COLD) 120-220 MG TB12; Take one every 12 hours.    The patient is advised to call or return to clinic if she does not see an improvement in symptoms, or to seek the care of the closest emergency department if she worsens with the above plan.   Deliah Boston, MHS, PA-C [facility] 08/23/2017 11:20 AM

## 2017-08-23 NOTE — Patient Instructions (Addendum)
  Lets try to whether these symptoms and give you body time to fight off a cold.    IF you received an x-ray today, you will receive an invoice from [facility]. Please contact [facility] at [phone] with questions or concerns regarding your invoice.   IF you received labwork today, you will receive an invoice from [facility]. Please contact [facility] at [phone] with questions or concerns regarding your invoice.   Our billing staff will not be able to assist you with questions regarding bills from these companies.  You will be contacted with the lab results as soon as they are available. The fastest way to get your results is to activate your My Chart account. Instructions are located on the last page of this paperwork. If you have not heard from [place] regarding the results in 2 weeks, please contact this office.

## 2017-08-25 LAB — CULTURE, GROUP A STREP: Strep A Culture: NEGATIVE

## 2017-09-03 NOTE — Progress Notes (Signed)
Please make patient aware of results via letter. In the context of her overall presentation any abnormal values are of no clinical significance.  Deliah BostonMichael Clark PA-C, 09/03/2017 9:04 AM

## 2017-09-25 DIAGNOSIS — Z01419 Encounter for gynecological examination (general) (routine) without abnormal findings: Secondary | ICD-10-CM | POA: Diagnosis not present

## 2017-09-25 DIAGNOSIS — Z6822 Body mass index (BMI) 22.0-22.9, adult: Secondary | ICD-10-CM | POA: Diagnosis not present

## 2017-11-08 DIAGNOSIS — R102 Pelvic and perineal pain: Secondary | ICD-10-CM | POA: Diagnosis not present

## 2018-01-08 DIAGNOSIS — R102 Pelvic and perineal pain: Secondary | ICD-10-CM | POA: Diagnosis not present

## 2018-04-18 DIAGNOSIS — Z3200 Encounter for pregnancy test, result unknown: Secondary | ICD-10-CM | POA: Diagnosis not present

## 2018-04-18 DIAGNOSIS — R102 Pelvic and perineal pain: Secondary | ICD-10-CM | POA: Diagnosis not present

## 2018-07-02 DIAGNOSIS — R7989 Other specified abnormal findings of blood chemistry: Secondary | ICD-10-CM | POA: Diagnosis not present

## 2018-07-17 ENCOUNTER — Other Ambulatory Visit: Payer: Self-pay | Admitting: Gastroenterology

## 2018-07-17 DIAGNOSIS — R1011 Right upper quadrant pain: Secondary | ICD-10-CM

## 2018-07-17 DIAGNOSIS — R7989 Other specified abnormal findings of blood chemistry: Secondary | ICD-10-CM

## 2018-07-17 DIAGNOSIS — R945 Abnormal results of liver function studies: Principal | ICD-10-CM

## 2018-07-17 DIAGNOSIS — R740 Nonspecific elevation of levels of transaminase and lactic acid dehydrogenase [LDH]: Secondary | ICD-10-CM | POA: Diagnosis not present

## 2018-07-23 ENCOUNTER — Ambulatory Visit
Admission: RE | Admit: 2018-07-23 | Discharge: 2018-07-23 | Disposition: A | Discharge status: Home / Self Care | Payer: 59 | Source: Ambulatory Visit | Attending: Gastroenterology | Admitting: Gastroenterology

## 2018-07-23 DIAGNOSIS — R7989 Other specified abnormal findings of blood chemistry: Secondary | ICD-10-CM

## 2018-07-23 DIAGNOSIS — R1011 Right upper quadrant pain: Secondary | ICD-10-CM

## 2018-07-23 DIAGNOSIS — K7689 Other specified diseases of liver: Secondary | ICD-10-CM | POA: Diagnosis not present

## 2018-07-23 DIAGNOSIS — R945 Abnormal results of liver function studies: Principal | ICD-10-CM

## 2018-07-24 DIAGNOSIS — R102 Pelvic and perineal pain: Secondary | ICD-10-CM | POA: Diagnosis not present

## 2018-08-26 DIAGNOSIS — N3000 Acute cystitis without hematuria: Secondary | ICD-10-CM | POA: Diagnosis not present

## 2018-08-26 DIAGNOSIS — R319 Hematuria, unspecified: Secondary | ICD-10-CM | POA: Diagnosis not present

## 2018-09-16 DIAGNOSIS — M9903 Segmental and somatic dysfunction of lumbar region: Secondary | ICD-10-CM | POA: Diagnosis not present

## 2018-09-16 DIAGNOSIS — M5441 Lumbago with sciatica, right side: Secondary | ICD-10-CM | POA: Diagnosis not present

## 2018-09-17 DIAGNOSIS — M9903 Segmental and somatic dysfunction of lumbar region: Secondary | ICD-10-CM | POA: Diagnosis not present

## 2018-09-17 DIAGNOSIS — M5441 Lumbago with sciatica, right side: Secondary | ICD-10-CM | POA: Diagnosis not present

## 2018-09-18 DIAGNOSIS — M9903 Segmental and somatic dysfunction of lumbar region: Secondary | ICD-10-CM | POA: Diagnosis not present

## 2018-09-18 DIAGNOSIS — M5441 Lumbago with sciatica, right side: Secondary | ICD-10-CM | POA: Diagnosis not present

## 2018-09-23 DIAGNOSIS — M9903 Segmental and somatic dysfunction of lumbar region: Secondary | ICD-10-CM | POA: Diagnosis not present

## 2018-09-23 DIAGNOSIS — M5441 Lumbago with sciatica, right side: Secondary | ICD-10-CM | POA: Diagnosis not present

## 2018-09-25 DIAGNOSIS — M5441 Lumbago with sciatica, right side: Secondary | ICD-10-CM | POA: Diagnosis not present

## 2018-09-25 DIAGNOSIS — M9903 Segmental and somatic dysfunction of lumbar region: Secondary | ICD-10-CM | POA: Diagnosis not present

## 2018-09-30 DIAGNOSIS — M5441 Lumbago with sciatica, right side: Secondary | ICD-10-CM | POA: Diagnosis not present

## 2018-09-30 DIAGNOSIS — M9903 Segmental and somatic dysfunction of lumbar region: Secondary | ICD-10-CM | POA: Diagnosis not present

## 2018-10-02 DIAGNOSIS — M5441 Lumbago with sciatica, right side: Secondary | ICD-10-CM | POA: Diagnosis not present

## 2018-10-02 DIAGNOSIS — Z01419 Encounter for gynecological examination (general) (routine) without abnormal findings: Secondary | ICD-10-CM | POA: Diagnosis not present

## 2018-10-02 DIAGNOSIS — Z6822 Body mass index (BMI) 22.0-22.9, adult: Secondary | ICD-10-CM | POA: Diagnosis not present

## 2018-10-02 DIAGNOSIS — M9903 Segmental and somatic dysfunction of lumbar region: Secondary | ICD-10-CM | POA: Diagnosis not present

## 2019-05-23 IMAGING — US US ABDOMEN LIMITED
1 series · 14 of 25 positions shown · non-contrast
Comparison: None.

CLINICAL DATA: Right upper quadrant abdominal pain. Elevated liver
function tests.

EXAM:
ULTRASOUND ABDOMEN LIMITED RIGHT UPPER QUADRANT

[Series 1: us abdomen limited · 0.25mm/px · 14 of 45 slices shown]
[im 1/45]
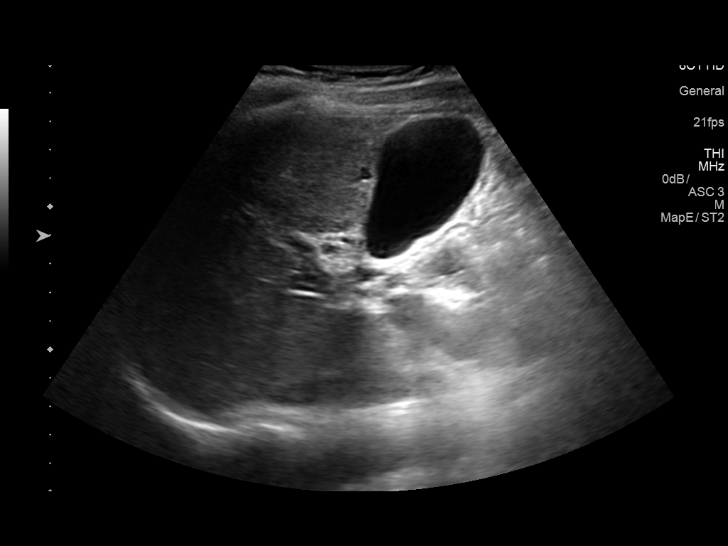
[im 4/45]
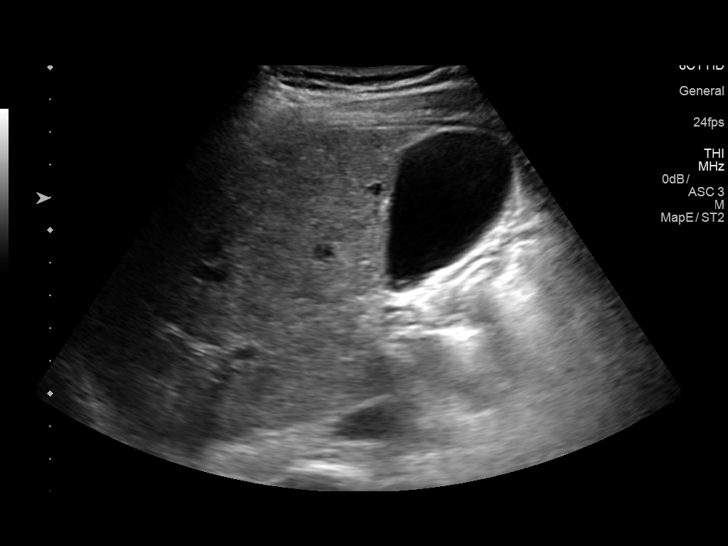
[im 8/45]
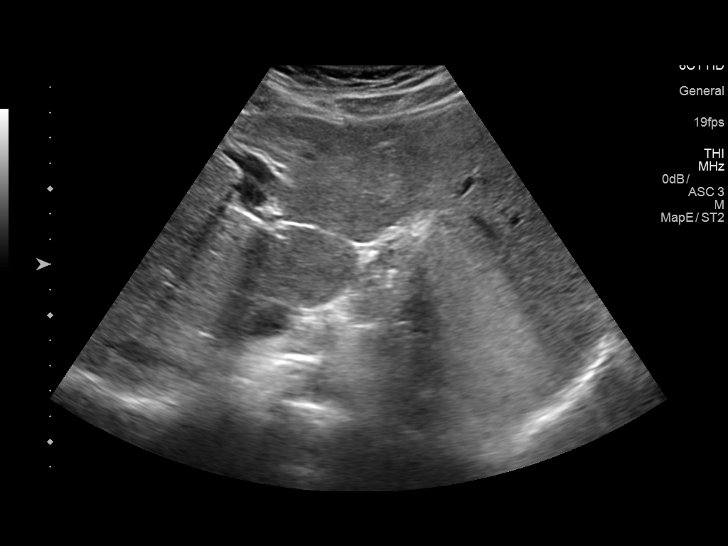
[im 12/45]
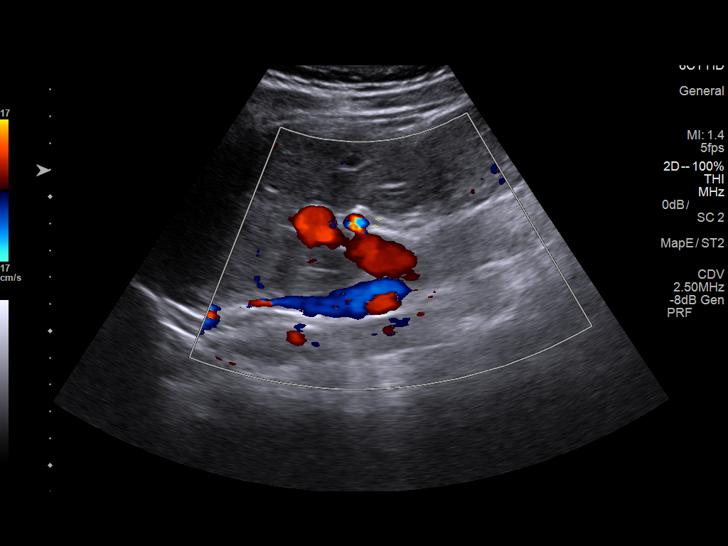
[im 15/45]
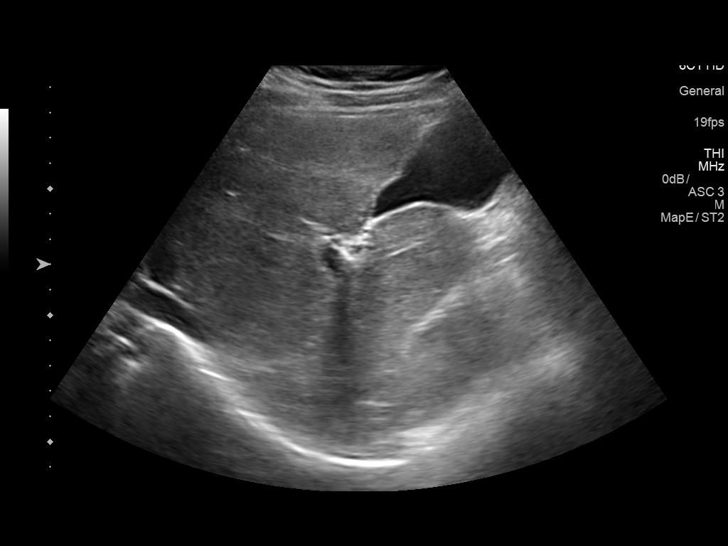
[im 17/45]
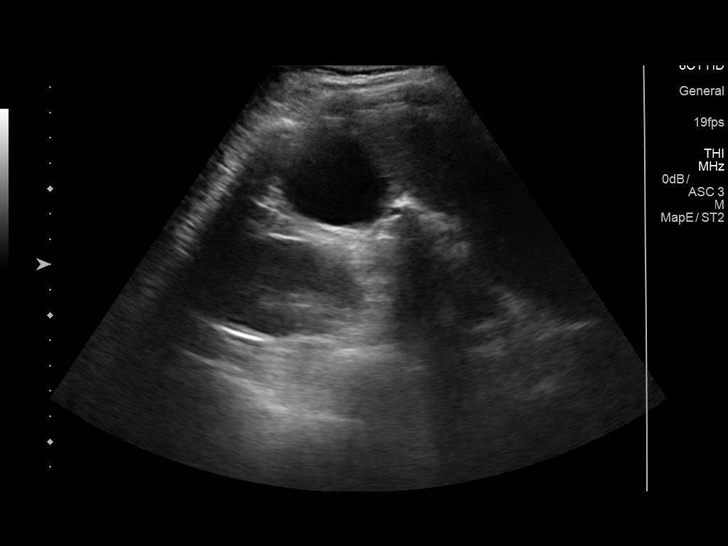
[im 21/45]
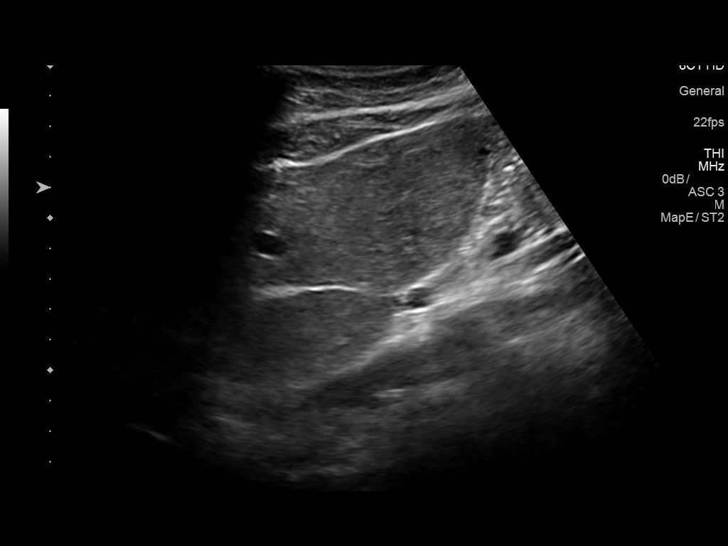
[im 24/45]
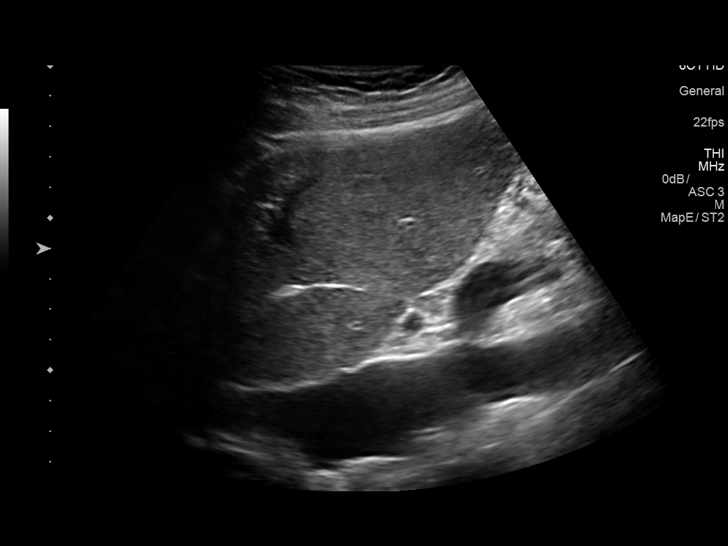
[im 28/45]
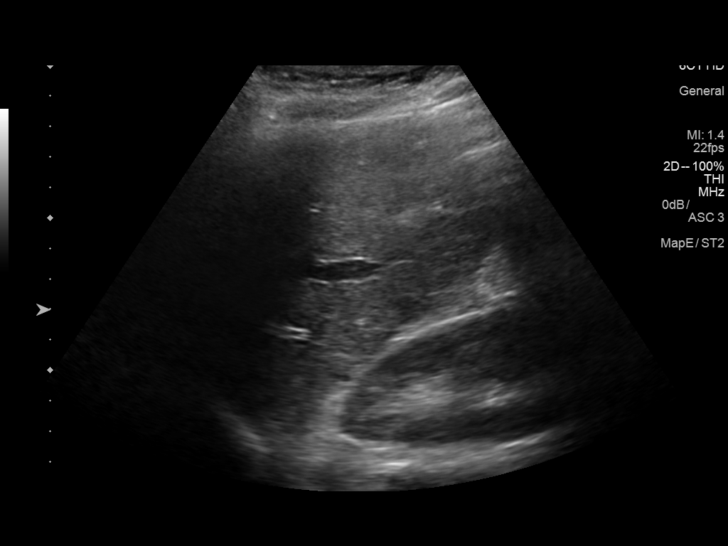
[im 30/45]
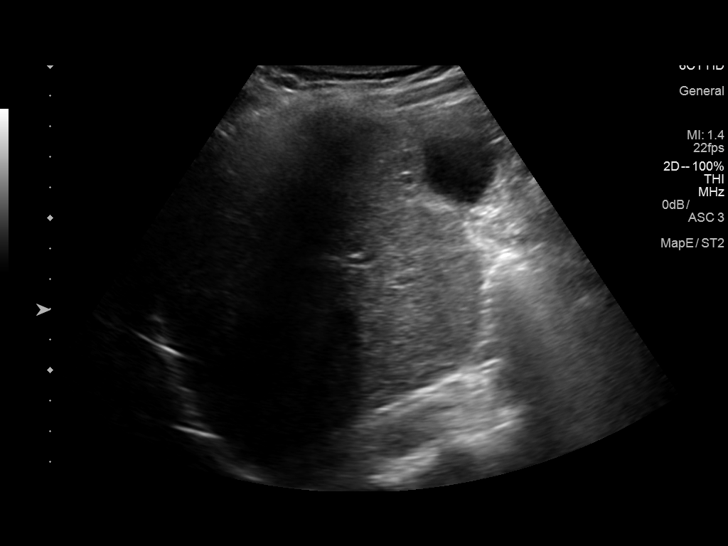
[im 34/45]
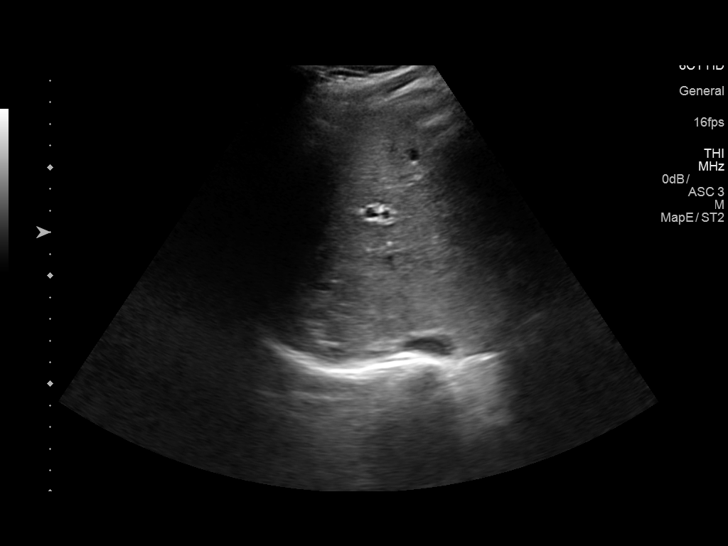
[im 37/45]
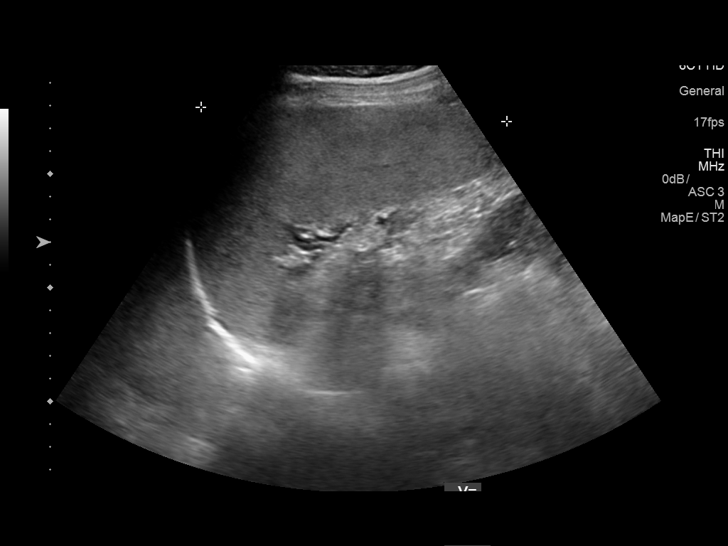
[im 41/45]
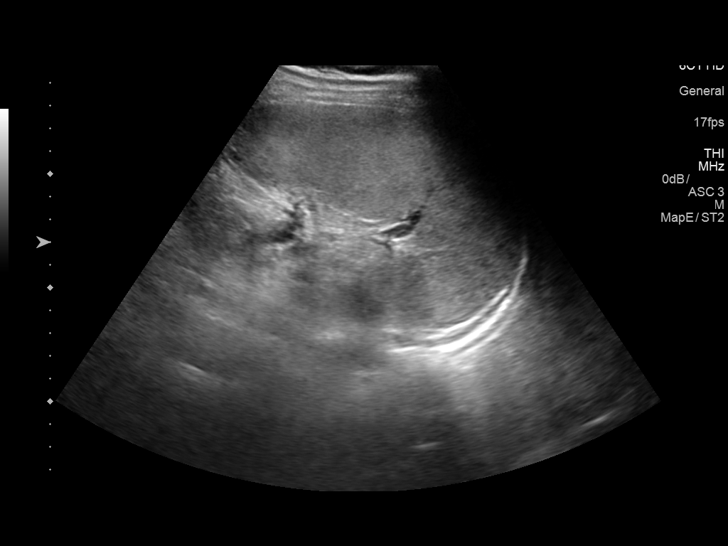
[im 45/45]
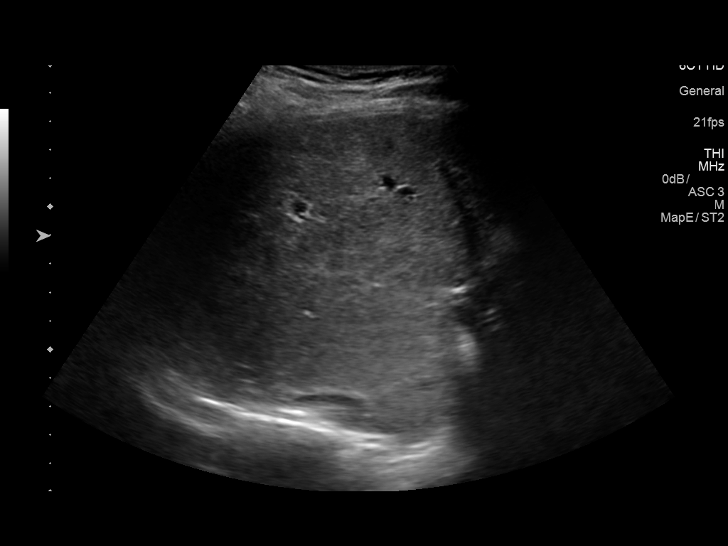

[14 of 25 positions shown; findings below may reference images not displayed]

FINDINGS: Gallbladder:

No gallstones or wall thickening visualized. No sonographic Murphy
sign noted by sonographer.

Common bile duct:

Diameter: 3.2 mm which is within normal limits.

Liver:

No focal abnormality is noted. Mildly nodular hepatic contours are
noted with coarse echotexture suggesting hepatic cirrhosis. Portal
vein is patent on color Doppler imaging with normal direction of
blood flow towards the liver.

The spleen has maximum measured diameter of 14.2 cm with calculated
volume of 664 cubic cm consistent with mild splenomegaly.
IMPRESSION: Coarse echotexture of hepatic parenchyma is noted with mildly
nodular hepatic margins suggesting hepatic cirrhosis. Probable mild
splenomegaly is noted as well.

## 2019-08-18 ENCOUNTER — Other Ambulatory Visit: Payer: Self-pay | Admitting: Registered"

## 2019-08-18 DIAGNOSIS — Z20822 Contact with and (suspected) exposure to covid-19: Secondary | ICD-10-CM

## 2019-08-20 LAB — NOVEL CORONAVIRUS, NAA: SARS-CoV-2, NAA: NOT DETECTED

## 2019-09-01 ENCOUNTER — Other Ambulatory Visit: Payer: Self-pay

## 2019-09-01 DIAGNOSIS — Z20822 Contact with and (suspected) exposure to covid-19: Secondary | ICD-10-CM

## 2019-09-03 LAB — NOVEL CORONAVIRUS, NAA: SARS-CoV-2, NAA: NOT DETECTED

## 2020-11-08 ENCOUNTER — Other Ambulatory Visit: Payer: 59

## 2020-11-24 ENCOUNTER — Other Ambulatory Visit: Payer: Self-pay | Admitting: Gastroenterology

## 2020-11-24 DIAGNOSIS — K754 Autoimmune hepatitis: Secondary | ICD-10-CM

## 2020-11-24 DIAGNOSIS — K7469 Other cirrhosis of liver: Secondary | ICD-10-CM

## 2020-12-03 ENCOUNTER — Ambulatory Visit (HOSPITAL_COMMUNITY): Admission: EM | Admit: 2020-12-03 | Discharge: 2020-12-03 | Payer: 59

## 2020-12-03 ENCOUNTER — Other Ambulatory Visit: Payer: Self-pay

## 2020-12-03 ENCOUNTER — Encounter (HOSPITAL_BASED_OUTPATIENT_CLINIC_OR_DEPARTMENT_OTHER): Payer: Self-pay | Admitting: *Deleted

## 2020-12-03 ENCOUNTER — Encounter (HOSPITAL_COMMUNITY): Payer: Self-pay

## 2020-12-03 ENCOUNTER — Emergency Department (HOSPITAL_BASED_OUTPATIENT_CLINIC_OR_DEPARTMENT_OTHER)
Admission: EM | Admit: 2020-12-03 | Discharge: 2020-12-03 | Disposition: A | Discharge status: Home / Self Care | Payer: 59 | Attending: Emergency Medicine | Admitting: Emergency Medicine

## 2020-12-03 DIAGNOSIS — R631 Polydipsia: Secondary | ICD-10-CM | POA: Insufficient documentation

## 2020-12-03 DIAGNOSIS — E0965 Drug or chemical induced diabetes mellitus with hyperglycemia: Secondary | ICD-10-CM | POA: Insufficient documentation

## 2020-12-03 DIAGNOSIS — R3589 Other polyuria: Secondary | ICD-10-CM | POA: Insufficient documentation

## 2020-12-03 DIAGNOSIS — R739 Hyperglycemia, unspecified: Secondary | ICD-10-CM | POA: Diagnosis not present

## 2020-12-03 DIAGNOSIS — H538 Other visual disturbances: Secondary | ICD-10-CM | POA: Diagnosis not present

## 2020-12-03 DIAGNOSIS — T380X5A Adverse effect of glucocorticoids and synthetic analogues, initial encounter: Secondary | ICD-10-CM

## 2020-12-03 HISTORY — DX: Inflammatory liver disease, unspecified: K75.9

## 2020-12-03 LAB — CBC
HCT: 39.9 % (ref 36.0–46.0)
Hemoglobin: 13.5 g/dL (ref 12.0–15.0)
MCH: 29.5 pg (ref 26.0–34.0)
MCHC: 33.8 g/dL (ref 30.0–36.0)
MCV: 87.1 fL (ref 80.0–100.0)
Platelets: 101 10*3/uL — ABNORMAL LOW (ref 150–400)
RBC: 4.58 MIL/uL (ref 3.87–5.11)
RDW: 13.2 % (ref 11.5–15.5)
WBC: 4.3 10*3/uL (ref 4.0–10.5)
nRBC: 0 % (ref 0.0–0.2)

## 2020-12-03 LAB — HEPATIC FUNCTION PANEL
ALT: 67 U/L — ABNORMAL HIGH (ref 0–44)
AST: 37 U/L (ref 15–41)
Albumin: 4.4 g/dL (ref 3.5–5.0)
Alkaline Phosphatase: 118 U/L (ref 38–126)
Bilirubin, Direct: 0.2 mg/dL (ref 0.0–0.2)
Indirect Bilirubin: 0.5 mg/dL (ref 0.3–0.9)
Total Bilirubin: 0.7 mg/dL (ref 0.3–1.2)
Total Protein: 7.9 g/dL (ref 6.5–8.1)

## 2020-12-03 LAB — CBG MONITORING, ED
Glucose-Capillary: >600 mg/dL (ref 70–99)
Glucose-Capillary: 477 mg/dL — ABNORMAL HIGH (ref 70–99)
Glucose-Capillary: 286 mg/dL — ABNORMAL HIGH (ref 70–99)

## 2020-12-03 LAB — URINALYSIS, MICROSCOPIC (REFLEX)

## 2020-12-03 LAB — BASIC METABOLIC PANEL
Anion gap: 12 (ref 5–15)
BUN: 10 mg/dL (ref 6–20)
CO2: 24 mmol/L (ref 22–32)
Calcium: 8.9 mg/dL (ref 8.9–10.3)
Chloride: 94 mmol/L — ABNORMAL LOW (ref 98–111)
Creatinine, Ser: 0.67 mg/dL (ref 0.44–1.00)
GFR, Estimated: >60 mL/min (ref >60)
Glucose, Bld: 515 mg/dL (ref 70–99)
Potassium: 4.1 mmol/L (ref 3.5–5.1)
Sodium: 130 mmol/L — ABNORMAL LOW (ref 135–145)

## 2020-12-03 LAB — HEMOGLOBIN A1C
Hgb A1c MFr Bld: 7.5 % — ABNORMAL HIGH (ref 4.8–5.6)
Mean Plasma Glucose: 168.55 mg/dL

## 2020-12-03 LAB — URINALYSIS, ROUTINE W REFLEX MICROSCOPIC
Bilirubin Urine: NEGATIVE
Glucose, UA: >=500 mg/dL — AB
Hgb urine dipstick: NEGATIVE
Ketones, ur: 15 mg/dL — AB
Leukocytes,Ua: NEGATIVE
Nitrite: NEGATIVE
Protein, ur: NEGATIVE mg/dL
Specific Gravity, Urine: 1.01 (ref 1.005–1.030)
pH: 6 (ref 5.0–8.0)

## 2020-12-03 LAB — PREGNANCY, URINE: Preg Test, Ur: NEGATIVE

## 2020-12-03 MED ORDER — INSULIN REGULAR HUMAN 100 UNIT/ML IJ SOLN
8.0000 [IU] | Freq: Once | INTRAMUSCULAR | Status: DC
  Filled 2020-12-03: qty 3

## 2020-12-03 MED ORDER — SODIUM CHLORIDE 0.9 % IV BOLUS
1000.0000 mL | Freq: Once | INTRAVENOUS | Status: AC
  Administered 2020-12-03: 1000 mL via INTRAVENOUS

## 2020-12-03 MED ORDER — INSULIN ASPART 100 UNIT/ML ~~LOC~~ SOLN
8.0000 [IU] | Freq: Once | SUBCUTANEOUS | Status: AC
  Administered 2020-12-03: 8 [IU] via SUBCUTANEOUS
  Filled 2020-12-03: qty 8

## 2020-12-03 NOTE — ED Provider Notes (Signed)
[facility] EMERGENCY DEPARTMENT Provider Note   CSN: [id number] Arrival date & time: 12/03/20  1231     History Chief Complaint  Patient presents with  . Hyperglycemia    Linda [name] is a 35 y.o. female.  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  Patient presented to urgent care with blurry vision -- described as blurry vision at distance, normal near vision.  She was started on prednisone therapy a little over a week ago.  Started at 60 mg daily, lowered to 40mg  after blurry vision started.  Blood sugar was found to be greater than 600.  Patient reports polydipsia and polyuria.  No symptoms of infection including fever, cough, sore throat.  She denies nausea, vomiting, diarrhea or abdominal pain.  No urinary symptoms other than increased frequency.  No skin rashes.  Patient follows with Dr. of GI.        Past Medical History:  Diagnosis Date  . Hepatitis     There are no problems to display for this patient.   History reviewed. No pertinent surgical history.   OB History   No obstetric history on file.     Family History  Problem Relation Age of Onset  . Diabetes Sister     Social History   Tobacco Use  . Smoking status: Never Smoker  . Smokeless tobacco: Never Used  Vaping Use  . Vaping Use: Never used  Substance Use Topics  . Alcohol use: Yes    Alcohol/week: 0.0 standard drinks  . Drug use: No    Home Medications Prior to Admission medications   Medication Sig Start Date End Date Taking? Authorizing Provider  levonorgestrel (MIRENA, 52 MG,) 20 MCG/24HR IUD Mirena 20 mcg/24 hours (7 yrs) 52 mg intrauterine device  Take by intrauterine route. 10/16/17  Yes [provider]  predniSONE (DELTASONE) 10 MG tablet Take by mouth. 11/23/20  Yes [provider]   HYDROcodone-homatropine (HYCODAN) 5-1.5 MG/5ML syrup Take 2.5-5 mLs by mouth at bedtime as needed. 08/23/17   10/23/17, PA-C  Norgestimate-Ethinyl Estradiol Triphasic 0.18/0.215/0.25 MG-25 MCG tab Take 1 tablet by mouth daily.    [provider]  Pseudoephedrine-Naproxen Na (ALEVE-D SINUS & COLD) 120-220 MG TB12 Take one every 12 hours. 08/23/17   10/23/17, PA-C    Allergies    Other  Review of Systems   Review of Systems  Constitutional: Negative for fever.  HENT: Negative for rhinorrhea and sore throat.   Eyes: Positive for visual disturbance. Negative for redness.  Respiratory: Negative for cough.   Cardiovascular: Negative for chest pain.  Gastrointestinal: Negative for abdominal pain, diarrhea, nausea and vomiting.  Endocrine: Positive for polydipsia and polyuria.  Genitourinary: Positive for frequency. Negative for dysuria, hematuria and urgency.  Musculoskeletal: Negative for myalgias.  Skin: Negative for rash.  Neurological: Negative for headaches.    Physical Exam Updated Vital Signs BP (!) 126/93 (BP Location: Right Arm)   Pulse 94   Temp 98.5 F (36.9 C) (Oral)   Resp 18   Ht 5\' 10"  (1.778 m)   Wt 72.5 kg   SpO2 98%   BMI 22.93 kg/m   Physical Exam Vitals and nursing note reviewed.  Constitutional:      General: She is not in acute distress.    Appearance: She is well-developed.  HENT:     Head: Normocephalic  and atraumatic.     Right Ear: External ear normal.     Left Ear: External ear normal.     Nose: Nose normal.     Mouth/Throat:     Mouth: Mucous membranes are moist.  Eyes:     Conjunctiva/sclera: Conjunctivae normal.  Cardiovascular:     Rate and Rhythm: Normal rate and regular rhythm.     Heart sounds: No murmur heard.   Pulmonary:     Effort: No respiratory distress.     Breath sounds: No wheezing, rhonchi or rales.  Abdominal:     Palpations: Abdomen is soft.     Tenderness: There is no abdominal tenderness.  There is no guarding or rebound.  Musculoskeletal:     Cervical back: Normal range of motion and neck supple.     Right lower leg: No edema.     Left lower leg: No edema.  Skin:    General: Skin is warm and dry.     Findings: No rash.  Neurological:     General: No focal deficit present.     Mental Status: She is alert. Mental status is at baseline.     Motor: No weakness.  Psychiatric:        Mood and Affect: Mood normal.     ED Results / Procedures / Treatments   Labs (all labs ordered are listed, but only abnormal results are displayed) Labs Reviewed  BASIC METABOLIC PANEL - Abnormal; Notable for the following components:      Result Value   Sodium 130 (*)    Chloride 94 (*)    Glucose, Bld 515 (*)    All other components within normal limits  CBC - Abnormal; Notable for the following components:   Platelets 101 (*)    All other components within normal limits  URINALYSIS, ROUTINE W REFLEX MICROSCOPIC - Abnormal; Notable for the following components:   Color, Urine STRAW (*)    Glucose, UA >=500 (*)    Ketones, ur 15 (*)    All other components within normal limits  URINALYSIS, MICROSCOPIC (REFLEX) - Abnormal; Notable for the following components:   Bacteria, UA RARE (*)    All other components within normal limits  HEPATIC FUNCTION PANEL - Abnormal; Notable for the following components:   ALT 67 (*)    All other components within normal limits  CBG MONITORING, ED - Abnormal; Notable for the following components:   Glucose-Capillary 477 (*)    All other components within normal limits  CBG MONITORING, ED - Abnormal; Notable for the following components:   Glucose-Capillary 286 (*)    All other components within normal limits  PREGNANCY, URINE  HEMOGLOBIN A1C    EKG None  Radiology No results found.  Procedures Procedures (including critical care time)  Medications Ordered in ED Medications  sodium chloride 0.9 % bolus 1,000 mL (has no administration in  time range)    ED Course  I have reviewed the triage vital signs and the nursing notes.  Pertinent labs & imaging results that were available during my care of the patient were reviewed by me and considered in my medical decision making (see chart for details).  Patient seen and examined. Work-up initiated. CBG here in 400's. Fluids ordered.  Doubt DKA but labs pending.    Vital signs reviewed and are as follows: BP (!) 126/93 (BP Location: Right Arm)   Pulse 94   Temp 98.5 F (36.9 C) (Oral)   Resp 18  Ht 5\' 10"  (1.778 m)   Wt 72.5 kg   SpO2 98%   BMI 22.93 kg/m   2:26 PM Discussed case with Dr. . Pt updated on results.   Spoke with Dr. Jodi Mourning for recommendations. Current plan: Discontinue prednisone.  Contact office on Monday.  No need for oral antihyperglycemic's currently.  They will likely restart prednisone at a low dose and titrate up slowly while monitoring blood sugar.  Requests LFTs be sent, ordered.  4:03 PM patient's blood sugar improved into the 200s.  Of note, LFTs look much better today with only ALT being mildly elevated.  Plan for discharge to home with follow-up as above.  Encouraged good hydration.  Patient states that her vision is not as blurry, but not entirely improved.  Discussed that if this is caused by high blood sugar, we would expect vision to improve as her sugars normalize.  If they are not improved by next week, consider follow-up with ophthalmology.  Patient urged to return with worsening symptoms or other concerns. Patient verbalized understanding and agrees with plan.      MDM Rules/Calculators/A&P                          Patient with prednisone induced hyperglycemia.  Hyperglycemia was treated in the ED with subcu insulin and IV hydration.  This is improved.  No evidence of ketoacidosis.  Patient looks well.  Follow-up obtained with the patient's GI physician and recommendations obtained.  Will discharge.   Final Clinical Impression(s) /  ED Diagnoses Final diagnoses:  Steroid-induced hyperglycemia    Rx / DC Orders ED Discharge Orders    None       Wednesday, PA-C 12/03/20 1605    12/05/20, MD 12/03/20 1606

## 2020-12-03 NOTE — ED Triage Notes (Addendum)
Pt reports that she has been taking prednisone for 10 days and c/o new onset blurred distance vision, "weird taste in mouth", and acne-like rash on Sunday/Monday.  Denies fever, n/v/d, lightheadedness/dizziness, abdominal pain, SOB, dysphagia.  Reports increase thirst, urination, and appetite since taking prednisone. Pt states she notified her doctor of blurred vision and he decreased her prednisone from 60mg  to 40mg . Notified L. , NP of pt status and CBG reading of "Hi". in for BS eval

## 2020-12-03 NOTE — Discharge Instructions (Signed)
Please go straight to the emergency department for treatment of high blood sugar.

## 2020-12-03 NOTE — ED Notes (Signed)
Patient is being discharged from the Urgent Care and sent to the Emergency Department via personal vehicle. Per Blondell Reveal, patient is in need of higher level of care due to neurological changes. Patient is aware and verbalizes understanding of plan of care.   Vitals:   12/03/20 1101  BP: (!) 127/93  Pulse: 87  Resp: 18  Temp: (!) 97.5 F (36.4 C)  SpO2: 97%

## 2020-12-03 NOTE — ED Triage Notes (Signed)
Here for new onset diabetes.  Pt was seen at [facility] for blurred vision, rash and "weird taste in her mouth" x 4 days. Here blood sugar was over 600.

## 2020-12-03 NOTE — Discharge Instructions (Signed)
Please read and follow all provided instructions.  Your diagnoses today include:  1. Steroid-induced hyperglycemia     Tests performed today include: Blood cell counts (white, red, and platelets) Electrolytes -high blood sugar, no signs of ketoacidosis Kidney function test Urine test to check for infection  Vital signs. See below for your results today.   Medications prescribed:   None  Take any prescribed medications only as directed.  Home care instructions:  Follow any educational materials contained in this packet.  BE VERY CAREFUL not to take multiple medicines containing Tylenol (also called acetaminophen). Doing so can lead to an overdose which can damage your liver and cause liver failure and possibly death.   Follow-up instructions: Please follow-up with Dr. Elnoria Howard on Monday for more instructions regarding treatment.    If your vision is not fully improved in the next 1 week, follow-up with an eye doctor or ophthalmologist for further evaluation.  Return instructions:   Please return to the Emergency Department if you experience worsening symptoms.   Please return if you have any other emergent concerns.  Additional Information:  Your vital signs today were: BP 118/84 (BP Location: Left Arm)   Pulse 84   Temp 98.5 F (36.9 C) (Oral)   Resp 18   Ht 5\' 10"  (1.778 m)   Wt 72.5 kg   SpO2 99%   BMI 22.93 kg/m  If your blood pressure (BP) was elevated above 135/85 this visit, please have this repeated by your doctor within one month. --------------

## 2020-12-03 NOTE — ED Notes (Signed)
Paged Dr. Je[facility]

## 2020-12-03 NOTE — ED Provider Notes (Signed)
[facility]    CSN: [id number] Arrival date & time: 12/03/20  6226      History   Chief Complaint Chief Complaint  Patient presents with  . blurred vision  . Rash    HPI Bellami L Weldin is a 35 y.o. female with history of autoimmune hepatitis presents to urgent care today with complaints of blurred vision.  Patient states she has been taking prednisone for 10 days with onset of blurred vision and rash approximately 5 days ago.  Patient also reports polyuria, polydipsia and polyphagia since beginning prednisone.  Her prednisone dose was decreased from 60 to 40 mg this week due to symptoms.  Patient denies any recent fever, chest pain, shortness of breath, N/V/D, lightheadedness or dizziness, abdominal pain.  Patient has sister with history of type 1 diabetes.   Past Medical History:  Diagnosis Date  . Hepatitis     There are no problems to display for this patient.   History reviewed. No pertinent surgical history.  OB History   No obstetric history on file.      Home Medications    Prior to Admission medications   Medication Sig Start Date End Date Taking? Authorizing Provider  levonorgestrel (MIRENA, 52 MG,) 20 MCG/24HR IUD Mirena 20 mcg/24 hours (7 yrs) 52 mg intrauterine device  Take by intrauterine route. 10/16/17  Yes [provider]  predniSONE (DELTASONE) 10 MG tablet Take by mouth. 11/23/20  Yes [provider]  HYDROcodone-homatropine (HYCODAN) 5-1.5 MG/5ML syrup Take 2.5-5 mLs by mouth at bedtime as needed. 08/23/17   Ofilia Neas, PA-C  Norgestimate-Ethinyl Estradiol Triphasic (TRI-LO-SPRINTEC) 0.18/0.215/0.25 MG-25 MCG tab Take 1 tablet by mouth daily.    [provider]  Pseudoephedrine-Naproxen Na (ALEVE-D SINUS & COLD) 120-220 MG TB12 Take one every 12 hours. 08/23/17   Ofilia Neas, PA-C    Family History Family History  Problem Relation Age of Onset  . Diabetes Sister     Social History Social History    Tobacco Use  . Smoking status: Never Smoker  . Smokeless tobacco: Never Used  Vaping Use  . Vaping Use: Never used  Substance Use Topics  . Alcohol use: Yes    Alcohol/week: 0.0 standard drinks  . Drug use: No     Allergies   Other   Review of Systems Stated in HPI otherwise negative   Physical Exam Triage Vital Signs ED Triage Vitals  Enc Vitals Group     BP 12/03/20 1101 (!) 127/93     Pulse Rate 12/03/20 1101 87     Resp 12/03/20 1101 18     Temp 12/03/20 1101 (!) 97.5 F (36.4 C)     Temp Source 12/03/20 1101 Oral     SpO2 12/03/20 1101 97 %     Weight --      Height --      Head Circumference --      Peak Flow --      Pain Score 12/03/20 1056 0     Pain Loc --      Pain Edu? --      Excl. in GC? --    No data found.  Updated Vital Signs BP (!) 127/93 (BP Location: Left Arm)   Pulse 87   Temp (!) 97.5 F (36.4 C) (Oral)   Resp 18   SpO2 97%   Visual Acuity Right Eye Distance: 20/200 Left Eye Distance: 20/200 Bilateral Distance: 20/200  Right Eye Near:   Left  Eye Near:    Bilateral Near:     Physical Exam Constitutional:      General: She is not in acute distress.    Appearance: Normal appearance. She is not ill-appearing or toxic-appearing.  Neurological:     Mental Status: She is alert.  Psychiatric:        Mood and Affect: Mood normal.        Behavior: Behavior normal.      UC Treatments / Results  Labs (all labs ordered are listed, but only abnormal results are displayed) Labs Reviewed  CBG MONITORING, ED - Abnormal; Notable for the following components:      Result Value   Glucose-Capillary >600 (*)    All other components within normal limits    EKG   Radiology No results found.  Procedures Procedures (including critical care time)  Medications Ordered in UC Medications - No data to display  Initial Impression / Assessment and Plan / UC Course  I have reviewed the triage vital signs and the nursing  notes.  Pertinent labs & imaging results that were available during my care of the patient were reviewed by me and considered in my medical decision making (see chart for details).  Hyperglycemia Patient presented to urgent care with complaints suspicious for hyperglycemia.  Fingerstick blood sugar reads >600.  Given history suspect new onset type 1 diabetes.  Patient instructed to go straight to emergency department to high blood sugar and assess for DKA.  Patient verifies understanding  Reviewed expections re: course of current medical issues. Questions answered. Outlined signs and symptoms indicating need for more acute intervention. Pt verbalized understanding. AVS given   Final Clinical Impressions(s) / UC Diagnoses   Final diagnoses:  Hyperglycemia     Discharge Instructions     Please go straight to the emergency department for treatment of high blood sugar.    ED Prescriptions    None     PDMP not reviewed this encounter.   Rolla Etienne, NP 12/03/20 [phone]

## 2020-12-16 ENCOUNTER — Ambulatory Visit
Admission: RE | Admit: 2020-12-16 | Discharge: 2020-12-16 | Disposition: A | Discharge status: Home / Self Care | Payer: 59 | Source: Ambulatory Visit | Attending: Gastroenterology | Admitting: Gastroenterology

## 2020-12-16 DIAGNOSIS — K754 Autoimmune hepatitis: Secondary | ICD-10-CM

## 2020-12-16 DIAGNOSIS — K7469 Other cirrhosis of liver: Secondary | ICD-10-CM

## 2022-07-24 IMAGING — US US ABDOMEN LIMITED
1 series · 14 of 25 positions shown · non-contrast
Comparison: 07/23/2018

CLINICAL DATA: Autoimmune hepatitis

Cirrhosis
EXAM:
ULTRASOUND ABDOMEN LIMITED RIGHT UPPER QUADRANT

[Series 1: us abdomen limited · 0.17mm/px · 14 of 41 slices shown]
[im 1/41]
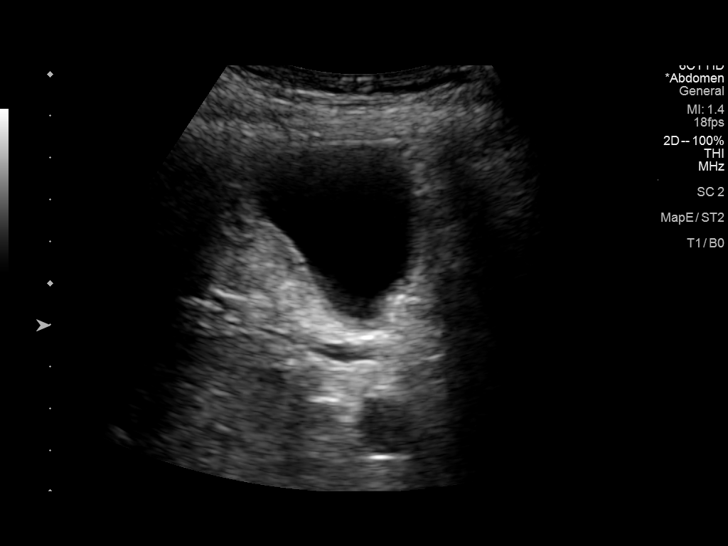
[im 4/41]
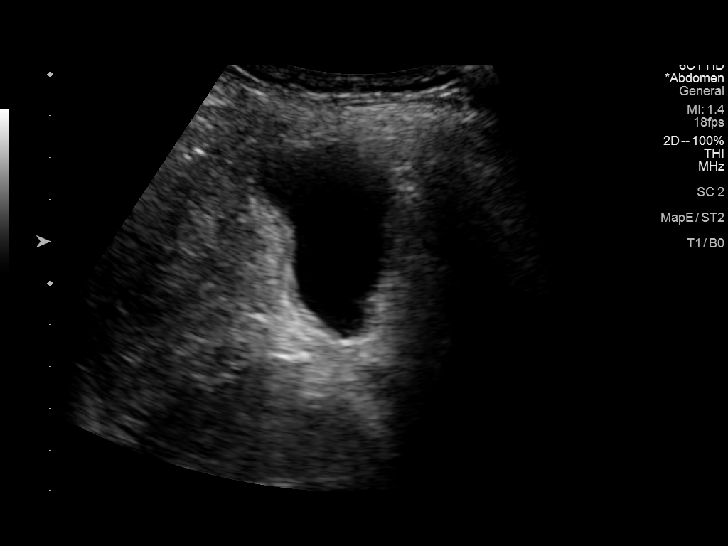
[im 7/41]
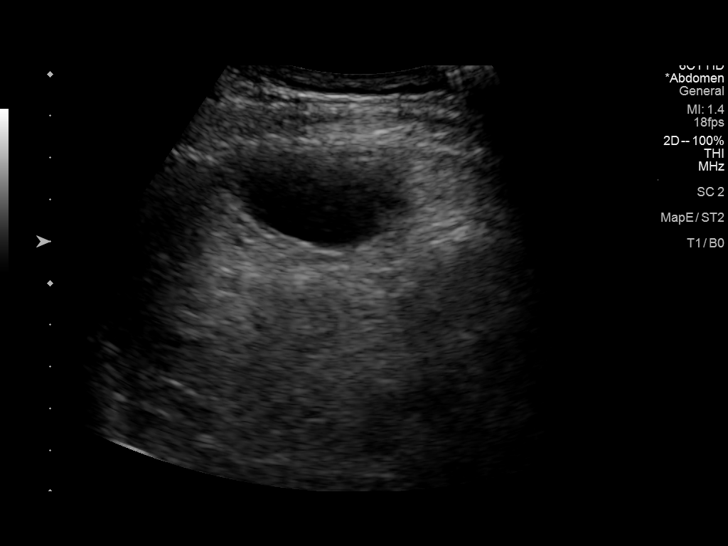
[im 11/41]
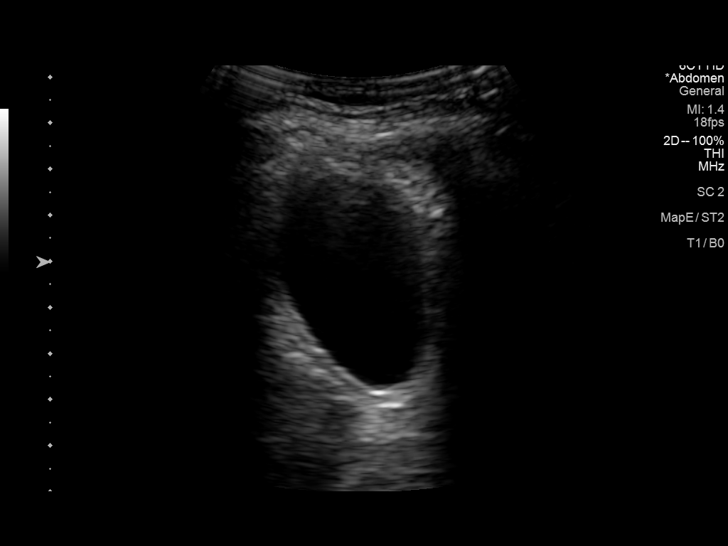
[im 14/41]
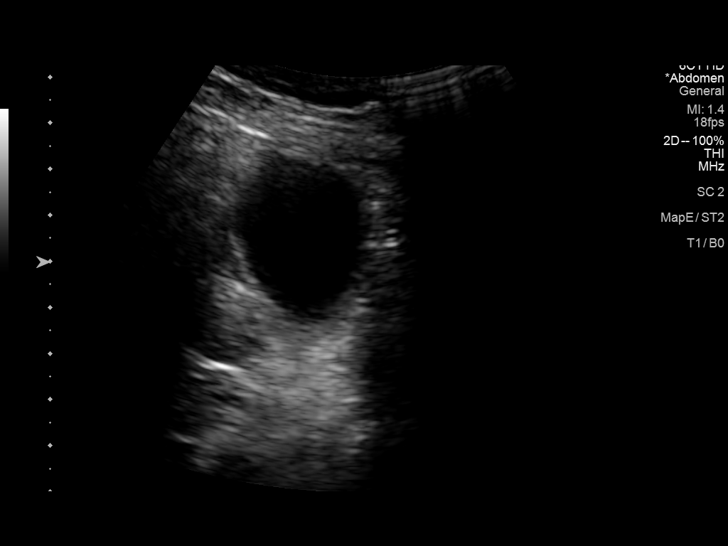
[im 16/41]
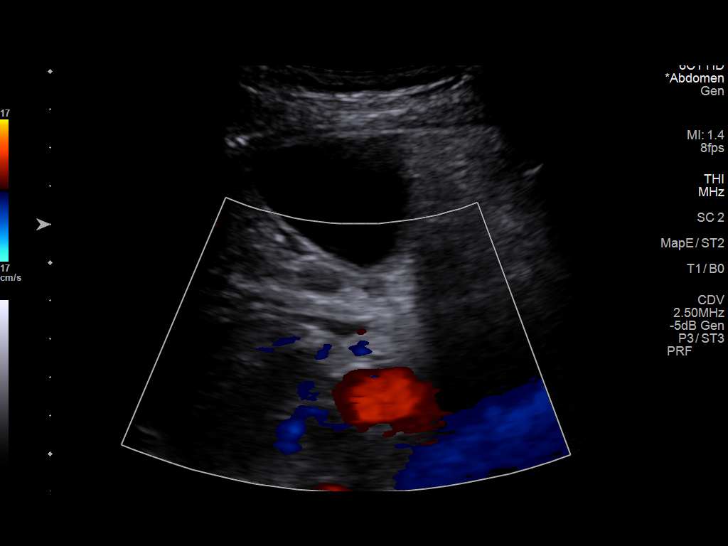
[im 19/41]
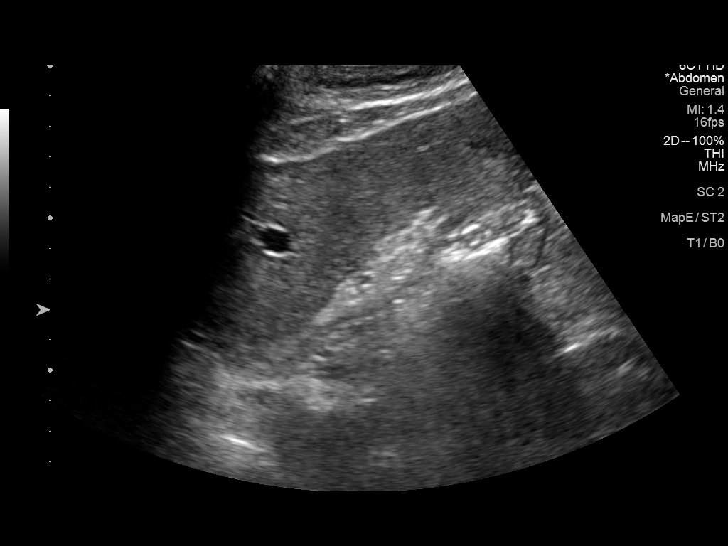
[im 22/41]
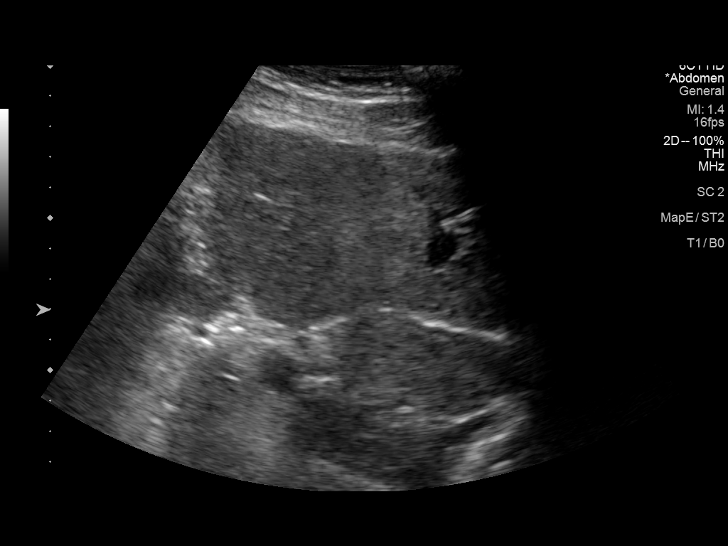
[im 26/41]
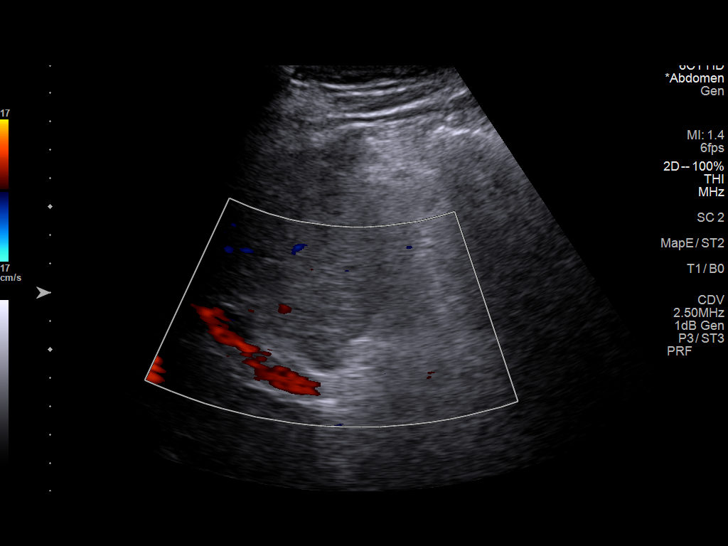
[im 27/41]
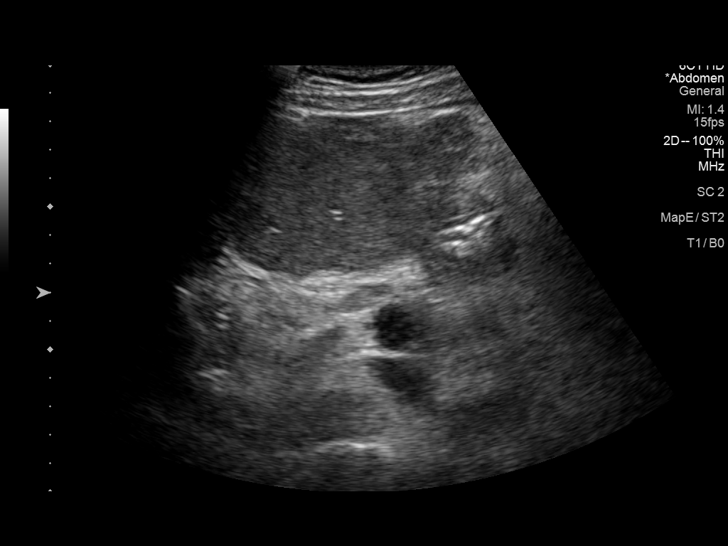
[im 31/41]
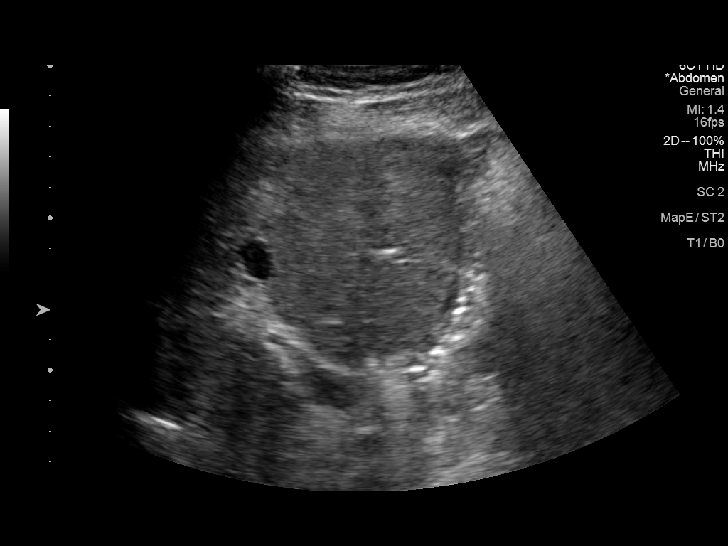
[im 34/41]
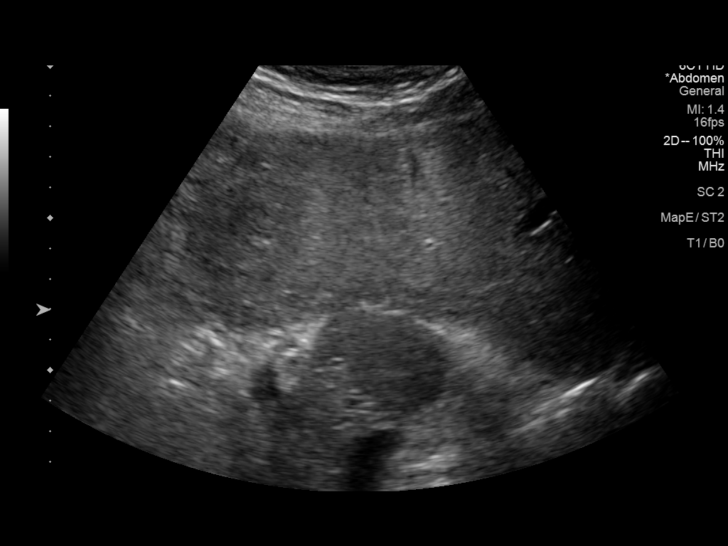
[im 37/41]
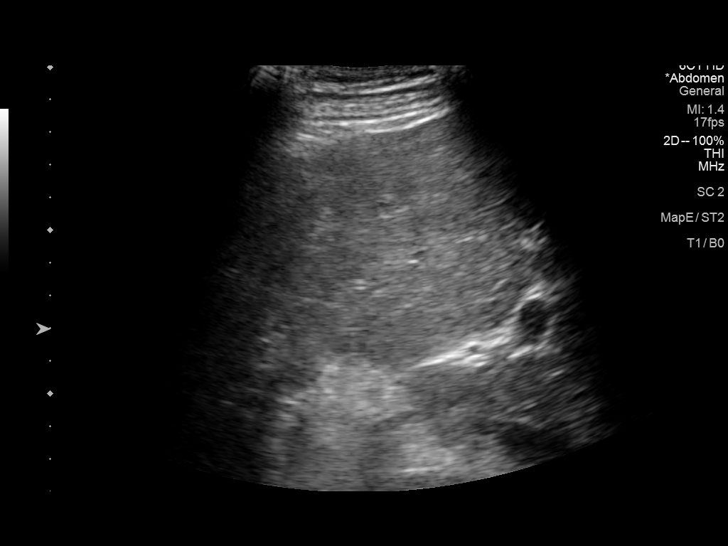
[im 41/41]
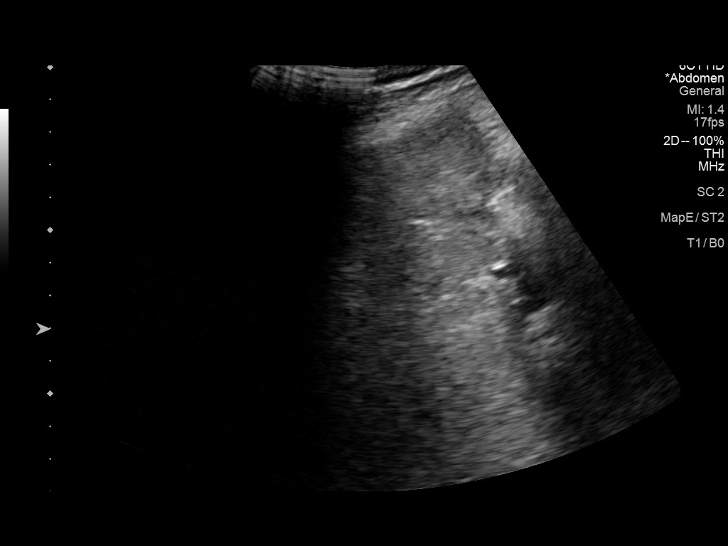

[14 of 25 positions shown; findings below may reference images not displayed]

FINDINGS: Gallbladder:

No gallstones or wall thickening visualized. No sonographic Murphy
sign noted by sonographer.

Common bile duct:

Diameter: 5 mm

Liver:

No focal hepatic lesion. Mild diffuse heterogeneity of hepatic
parenchyma with mildly nodular contours consistent with cirrhosis.
Portal vein is patent on color Doppler imaging with normal direction
of blood flow towards the liver.

Other: None.
IMPRESSION: Cirrhotic liver morphology without discrete hepatic lesion.

## 2023-04-14 ENCOUNTER — Emergency Department (HOSPITAL_COMMUNITY)
Admission: EM | Admit: 2023-04-14 | Discharge: 2023-04-14 | Disposition: A | Discharge status: Home / Self Care | Payer: 59 | Attending: Emergency Medicine | Admitting: Emergency Medicine

## 2023-04-14 ENCOUNTER — Other Ambulatory Visit: Payer: Self-pay

## 2023-04-14 ENCOUNTER — Encounter (HOSPITAL_COMMUNITY): Payer: Self-pay

## 2023-04-14 DIAGNOSIS — D649 Anemia, unspecified: Secondary | ICD-10-CM | POA: Diagnosis present

## 2023-04-14 DIAGNOSIS — D696 Thrombocytopenia, unspecified: Secondary | ICD-10-CM | POA: Insufficient documentation

## 2023-04-14 DIAGNOSIS — D709 Neutropenia, unspecified: Secondary | ICD-10-CM | POA: Insufficient documentation

## 2023-04-14 HISTORY — DX: Type 2 diabetes mellitus without complications: E11.9

## 2023-04-14 LAB — CBC WITH DIFFERENTIAL/PLATELET
Abs Immature Granulocytes: 0 10*3/uL (ref 0.00–0.07)
Basophils Absolute: 0 10*3/uL (ref 0.0–0.1)
Basophils Relative: 0 %
Eosinophils Absolute: 0 10*3/uL (ref 0.0–0.5)
Eosinophils Relative: 5 %
HCT: 21.8 % — ABNORMAL LOW (ref 36.0–46.0)
Hemoglobin: 7.6 g/dL — ABNORMAL LOW (ref 12.0–15.0)
Immature Granulocytes: 0 %
Lymphocytes Relative: 21 %
Lymphs Abs: 0.2 10*3/uL — ABNORMAL LOW (ref 0.7–4.0)
MCH: 39.8 pg — ABNORMAL HIGH (ref 26.0–34.0)
MCHC: 34.9 g/dL (ref 30.0–36.0)
MCV: 114.1 fL — ABNORMAL HIGH (ref 80.0–100.0)
Monocytes Absolute: 0.1 10*3/uL (ref 0.1–1.0)
Monocytes Relative: 7 %
Neutro Abs: 0.6 10*3/uL — ABNORMAL LOW (ref 1.7–7.7)
Neutrophils Relative %: 67 %
Platelets: 46 10*3/uL — ABNORMAL LOW (ref 150–400)
RBC: 1.91 MIL/uL — ABNORMAL LOW (ref 3.87–5.11)
RDW: 19.6 % — ABNORMAL HIGH (ref 11.5–15.5)
WBC: 0.9 10*3/uL — CL (ref 4.0–10.5)
nRBC: 0 % (ref 0.0–0.2)

## 2023-04-14 LAB — COMPREHENSIVE METABOLIC PANEL
ALT: 29 U/L (ref 0–44)
AST: 36 U/L (ref 15–41)
Albumin: 3.8 g/dL (ref 3.5–5.0)
Alkaline Phosphatase: 72 U/L (ref 38–126)
Anion gap: 8 (ref 5–15)
BUN: 12 mg/dL (ref 6–20)
CO2: 22 mmol/L (ref 22–32)
Calcium: 8.2 mg/dL — ABNORMAL LOW (ref 8.9–10.3)
Chloride: 106 mmol/L (ref 98–111)
Creatinine, Ser: 0.34 mg/dL — ABNORMAL LOW (ref 0.44–1.00)
GFR, Estimated: >60 mL/min (ref >60)
Glucose, Bld: 161 mg/dL — ABNORMAL HIGH (ref 70–99)
Potassium: 4.2 mmol/L (ref 3.5–5.1)
Sodium: 136 mmol/L (ref 135–145)
Total Bilirubin: 2.9 mg/dL — ABNORMAL HIGH (ref 0.3–1.2)
Total Protein: 6.8 g/dL (ref 6.5–8.1)

## 2023-04-14 LAB — TYPE AND SCREEN
ABO/RH(D): AB POS
Antibody Screen: NEGATIVE

## 2023-04-14 NOTE — ED Provider Notes (Signed)
[place] EMERGENCY DEPARTMENT AT [facility] Provider Note   CSN: [id number] Arrival date & time: 04/14/23  4098     History  Chief Complaint  Patient presents with   abnormal labs    Linda [name] is a 37 y.o. female.  37 year old female with medical history as detailed below presents for evaluation.  Patient with known history of autoimmune hepatitis.  Patient has been on Imuran for quite some time.  She is followed by [facility] for same.  Labs performed yesterday revealed pancytopenia.  Patient was directed to the ED for evaluation.  Patient is without specific complaint.  She denies fever, fatigue, weakness, pain.  The history is provided by the patient and medical records.       Home Medications Prior to Admission medications   Medication Sig Start Date End Date Taking? Authorizing Provider  HYDROcodone-homatropine (HYCODAN) 5-1.5 MG/5ML syrup Take 2.5-5 mLs by mouth at bedtime as needed. 08/23/17   Ofilia Neas, PA-C  levonorgestrel (MIRENA, 52 MG,) 20 MCG/24HR IUD Mirena 20 mcg/24 hours (7 yrs) 52 mg intrauterine device  Take by intrauterine route. 10/16/17   [provider]  Norgestimate-Ethinyl Estradiol Triphasic 0.18/0.215/0.25 MG-25 MCG tab Take 1 tablet by mouth daily.    [provider]  predniSONE (DELTASONE) 10 MG tablet Take by mouth. 11/23/20   [provider]  Pseudoephedrine-Naproxen Na (ALEVE-D SINUS & COLD) 120-220 MG TB12 Take one every 12 hours. 08/23/17   Ofilia Neas, PA-C      Allergies    Other    Review of Systems   Review of Systems  All other systems reviewed and are negative.   Physical Exam Updated Vital Signs BP 130/85 (BP Location: Left Arm)   Pulse (!) 110   Temp 98.7 F (37.1 C) (Oral)   Resp 18   Ht 5\' 10"  (1.778 m)   Wt 77.1 kg   SpO2 100%   BMI 24.39 kg/m  Physical Exam Vitals and nursing note reviewed.  Constitutional:      General: She is not in acute  distress.    Appearance: Normal appearance. She is well-developed.  HENT:     Head: Normocephalic and atraumatic.  Eyes:     Conjunctiva/sclera: Conjunctivae normal.     Pupils: Pupils are equal, round, and reactive to light.  Cardiovascular:     Rate and Rhythm: Normal rate and regular rhythm.     Heart sounds: Normal heart sounds.  Pulmonary:     Effort: Pulmonary effort is normal. No respiratory distress.     Breath sounds: Normal breath sounds.  Abdominal:     General: There is no distension.     Palpations: Abdomen is soft.     Tenderness: There is no abdominal tenderness.  Musculoskeletal:        General: No deformity. Normal range of motion.     Cervical back: Normal range of motion and neck supple.  Skin:    General: Skin is warm and dry.  Neurological:     General: No focal deficit present.     Mental Status: She is alert and oriented to person, place, and time.     ED Results / Procedures / Treatments   Labs (all labs ordered are listed, but only abnormal results are displayed) Labs Reviewed  CBC WITH DIFFERENTIAL/PLATELET  COMPREHENSIVE METABOLIC PANEL  TYPE AND SCREEN    EKG None  Radiology No results found.  Procedures Procedures    Medications  Ordered in ED Medications - No data to display  ED Course/ Medical Decision Making/ A&P                             Medical Decision Making Amount and/or Complexity of Data Reviewed Labs: ordered.    Medical Screen Complete  This patient presented to the ED with complaint of abnormal labs.  This complaint involves an extensive number of treatment options. The initial differential diagnosis includes, but is not limited to, pancytopenia  This presentation is: Acute, Chronic, Self-Limited, Previously Undiagnosed, Uncertain Prognosis, Complicated, Systemic Symptoms, and Threat to Life/Bodily Function  Patient with history of autoimmune hepatitis currently on Imuran.  Patient sent in from her primary  GI team with [facility] for evaluation of worsening pancytopenia.  Patient without symptoms today.  Labs obtained today are without significant change from labs recently obtained within the last several days as an outpatient.  Patient has stopped taking her Imuran already.  Case discussed briefly with on-call GI coverage at [facility] - Dr. Lenon Curt.  He agrees with plan to stop Imuran.  Patient should follow up with Dr. Joellyn Rued [facility]) on Monday.  Dr. Lenon Curt agrees that patient does not require prophylactic antibiotics.  Patient understands plan of care.  Importance of close follow-up is stressed.  Strict return precautions - including neutropenic precautions - discussed extensively with the patient.   Additional history obtained:  External records from outside sources obtained and reviewed including prior ED visits and prior Inpatient records.    Lab Tests:  I ordered and personally interpreted labs.   Problem List / ED Course:  Pancytopenia   Reevaluation:  After the interventions noted above, I reevaluated the patient and found that they have: stayed the same  Disposition:  After consideration of the diagnostic results and the patients response to treatment, I feel that the patent would benefit from close outpatient follow-up.          Final Clinical Impression(s) / ED Diagnoses Final diagnoses:  Anemia, unspecified type  Thrombocytopenia (HCC)  Neutropenia, unspecified type [facility])    Rx / DC Orders ED Discharge Orders     None         Wynetta Fines, MD 04/14/23 1537

## 2023-04-14 NOTE — ED Triage Notes (Signed)
Pt sent from pcp with abnormal lab values wbc 0.70,  hbg 7.8.

## 2023-04-14 NOTE — Discharge Instructions (Signed)
Return for any problem.  Stop taking Imuran as discussed.    Follow-up closely with Dr. Joellyn Rued on Monday.  If you develop fever, bleeding, weakness, shortness of breath return immediately to the ED.

## 2023-05-03 ENCOUNTER — Emergency Department (HOSPITAL_COMMUNITY): Payer: 59

## 2023-05-03 ENCOUNTER — Other Ambulatory Visit: Payer: Self-pay

## 2023-05-03 ENCOUNTER — Inpatient Hospital Stay (HOSPITAL_COMMUNITY)
Admission: EM | Admit: 2023-05-03 | Discharge: 2023-05-05 | DRG: 810 | Disposition: A | Discharge status: Home / Self Care | Payer: 59 | Attending: Internal Medicine | Admitting: Internal Medicine

## 2023-05-03 DIAGNOSIS — Z975 Presence of (intrauterine) contraceptive device: Secondary | ICD-10-CM

## 2023-05-03 DIAGNOSIS — D702 Other drug-induced agranulocytosis: Secondary | ICD-10-CM | POA: Diagnosis not present

## 2023-05-03 DIAGNOSIS — K754 Autoimmune hepatitis: Secondary | ICD-10-CM | POA: Diagnosis present

## 2023-05-03 DIAGNOSIS — Z794 Long term (current) use of insulin: Secondary | ICD-10-CM

## 2023-05-03 DIAGNOSIS — R5081 Fever presenting with conditions classified elsewhere: Secondary | ICD-10-CM | POA: Diagnosis present

## 2023-05-03 DIAGNOSIS — T451X5A Adverse effect of antineoplastic and immunosuppressive drugs, initial encounter: Secondary | ICD-10-CM | POA: Diagnosis present

## 2023-05-03 DIAGNOSIS — K7460 Unspecified cirrhosis of liver: Secondary | ICD-10-CM | POA: Diagnosis present

## 2023-05-03 DIAGNOSIS — Z79899 Other long term (current) drug therapy: Secondary | ICD-10-CM

## 2023-05-03 DIAGNOSIS — E109 Type 1 diabetes mellitus without complications: Secondary | ICD-10-CM | POA: Diagnosis present

## 2023-05-03 DIAGNOSIS — R000 Tachycardia, unspecified: Secondary | ICD-10-CM | POA: Diagnosis present

## 2023-05-03 DIAGNOSIS — E119 Type 2 diabetes mellitus without complications: Secondary | ICD-10-CM

## 2023-05-03 DIAGNOSIS — D709 Neutropenia, unspecified: Secondary | ICD-10-CM | POA: Diagnosis not present

## 2023-05-03 DIAGNOSIS — Z1152 Encounter for screening for COVID-19: Secondary | ICD-10-CM

## 2023-05-03 DIAGNOSIS — Y92239 Unspecified place in hospital as the place of occurrence of the external cause: Secondary | ICD-10-CM | POA: Diagnosis present

## 2023-05-03 DIAGNOSIS — Z833 Family history of diabetes mellitus: Secondary | ICD-10-CM

## 2023-05-03 DIAGNOSIS — Z91018 Allergy to other foods: Secondary | ICD-10-CM

## 2023-05-03 DIAGNOSIS — D61818 Other pancytopenia: Secondary | ICD-10-CM | POA: Diagnosis present

## 2023-05-03 MED ORDER — LACTATED RINGERS IV SOLN: INTRAVENOUS | Status: AC

## 2023-05-03 MED ORDER — LACTATED RINGERS IV BOLUS (SEPSIS)
1000.0000 mL | Freq: Once | INTRAVENOUS | Status: AC
  Administered 2023-05-04: 1000 mL via INTRAVENOUS

## 2023-05-03 MED ORDER — SODIUM CHLORIDE 0.9 % IV SOLN
2.0000 g | Freq: Once | INTRAVENOUS | Status: AC
  Administered 2023-05-03: 2 g via INTRAVENOUS
  Filled 2023-05-03: qty 12.5

## 2023-05-03 NOTE — ED Triage Notes (Signed)
Pt arrives with reports of fever, body aches and headache that began today. Pt had recent blood work taken with a WBC <0.9 due to medication she is taking for autoimmune hepatitis. Pt was told to come to ER with any fevers.

## 2023-05-03 NOTE — ED Provider Notes (Signed)
[facility] DEPARTMENT AT [facility] Provider Note   CSN: [id number] Arrival date & time: 05/03/23  2114     History  Chief Complaint  Patient presents with   Fever   Generalized Body Aches    Linda [name] is a 37 y.o. female with neutropenia secondary to Imuran for treatment of autoimmune hepatitis who presents this evening with fever of 103 F at home with associated body aches, dry cough, and diarrhea.  Denies melena or hematochezia, denies productive cough.  States that she was feeling well earlier in the day but around 7 PM started to feel poorly with bodyaches.  No known ill contacts.  I personally reviewed patient's History. Recent ED visit for pancytopenia:  ANC on 6/1 was 0.6.  Patient also notably anemic with hemoglobin of 7.6, thrombocytopenic with platelets of 46.  I personally reviewed her medical records.  In addition to the above listed history she is history of type 1 diabetes. unknown LMP with IUD in place. Denies sexual activity for > 1 year.   HPI     Home Medications Prior to Admission medications   Medication Sig Start Date End Date Taking? Authorizing Provider  ibuprofen (ADVIL) 200 MG tablet Take 200 mg by mouth every 6 (six) hours as needed for fever.   Yes [provider]  Insulin Lispro-aabc (LYUMJEV KWIKPEN) 100 UNIT/ML KwikPen INJECT 1 UNIT UNDER THE SKIN PER 15 GRAMS CARBOHYDRATES PLUS SLIDING SCALE. ADD 1 UNIT PER GLUCOSE 50 ABOVE 150. TOTAL 30 UNITS/DAY. 12/28/21  Yes [provider]  levonorgestrel (MIRENA, 52 MG,) 20 MCG/24HR IUD Mirena 20 mcg/24 hours (7 yrs) 52 mg intrauterine device  Take by intrauterine route. 10/16/17  Yes [provider]  TOUJEO SOLOSTAR 300 UNIT/ML Solostar Pen Inject 16 Units into the skin at bedtime.   Yes [provider]      Allergies    Other    Review of Systems   Review of Systems  Constitutional:  Positive for chills, fatigue and fever.  HENT:  Positive for  sore throat.   Respiratory:  Positive for cough. Negative for chest tightness and shortness of breath.   Cardiovascular: Negative.   Gastrointestinal:  Positive for nausea. Negative for abdominal distention, abdominal pain, blood in stool, diarrhea and vomiting.  Genitourinary: Negative.   Musculoskeletal: Negative.   Skin: Negative.   Neurological:  Positive for headaches. Negative for dizziness, weakness and light-headedness.    Physical Exam Updated Vital Signs BP 119/81   Pulse (!) 112   Temp (!) 102.2 F (39 C) (Oral)   Resp 16   Ht 5\' 10"  (1.778 m)   Wt 74.8 kg   SpO2 98%   BMI 23.68 kg/m  Physical Exam Vitals and nursing note reviewed.  Constitutional:      Appearance: She is not ill-appearing or toxic-appearing.  HENT:     Head: Normocephalic and atraumatic.     Mouth/Throat:     Mouth: Mucous membranes are moist.     Pharynx: Oropharynx is clear. Posterior oropharyngeal erythema present. No oropharyngeal exudate.     Tonsils: No tonsillar exudate.  Eyes:     General:        Right eye: No discharge.        Left eye: No discharge.     Extraocular Movements: Extraocular movements intact.     Conjunctiva/sclera: Conjunctivae normal.     Pupils: Pupils are equal, round, and reactive to light.  Cardiovascular:  Rate and Rhythm: Regular rhythm. Tachycardia present.     Pulses: Normal pulses.     Heart sounds: Normal heart sounds. No murmur heard. Pulmonary:     Effort: Pulmonary effort is normal. No respiratory distress.     Breath sounds: Normal breath sounds. No wheezing or rales.  Abdominal:     General: Bowel sounds are normal. There is no distension.     Palpations: Abdomen is soft.     Tenderness: There is no abdominal tenderness. There is no right CVA tenderness, left CVA tenderness, guarding or rebound.  Musculoskeletal:        General: No deformity.     Cervical back: Neck supple.     Right lower leg: No edema.     Left lower leg: No edema.   Skin:    General: Skin is warm and dry.     Capillary Refill: Capillary refill takes less than 2 seconds.  Neurological:     General: No focal deficit present.     Mental Status: She is alert and oriented to person, place, and time. Mental status is at baseline.  Psychiatric:        Mood and Affect: Mood normal.     ED Results / Procedures / Treatments   Labs (all labs ordered are listed, but only abnormal results are displayed) Labs Reviewed  COMPREHENSIVE METABOLIC PANEL - Abnormal; Notable for the following components:      Result Value   CO2 21 (*)    Glucose, Bld 275 (*)    Calcium 8.2 (*)    Total Bilirubin 2.7 (*)    All other components within normal limits  CBC WITH DIFFERENTIAL/PLATELET - Abnormal; Notable for the following components:   WBC 2.0 (*)    RBC 2.03 (*)    Hemoglobin 8.1 (*)    HCT 24.0 (*)    MCV 118.2 (*)    MCH 39.9 (*)    RDW 17.1 (*)    Platelets 85 (*)    Neutro Abs 1.5 (*)    Lymphs Abs 0.2 (*)    All other components within normal limits  PROTIME-INR - Abnormal; Notable for the following components:   Prothrombin Time 18.2 (*)    INR 1.5 (*)    All other components within normal limits  URINALYSIS, W/ REFLEX TO CULTURE (INFECTION SUSPECTED) - Abnormal; Notable for the following components:   Glucose, UA >=500 (*)    All other components within normal limits  RESP PANEL BY RT-PCR (RSV, FLU A&B, COVID)  RVPGX2  CULTURE, BLOOD (ROUTINE X 2)  CULTURE, BLOOD (ROUTINE X 2)  LACTIC ACID, PLASMA  APTT  HCG, QUANTITATIVE, PREGNANCY  LACTIC ACID, PLASMA  HEMOGLOBIN A1C  THIOPURINE METHYLTRANSFERASE (TPMT), RBC  HIV ANTIBODY (ROUTINE TESTING W REFLEX)  CBC  BASIC METABOLIC PANEL    EKG None  Radiology DG Chest Port 1 View  Result Date: 05/03/2023 CLINICAL DATA:  Fever body ache EXAM: PORTABLE CHEST 1 VIEW COMPARISON:  11/23/2016 FINDINGS: The heart size and mediastinal contours are within normal limits. Both lungs are clear. The  visualized skeletal structures are unremarkable. IMPRESSION: No active disease. Electronically Signed   By: Jasmine Pang M.D.   On: 05/03/2023 22:29    Procedures .Critical Care  Performed by: Paris Lore, PA-C Authorized by: Paris Lore, PA-C   Critical care provider statement:    Critical care time (minutes):  45   Critical care was time spent personally by me on the following  activities:  Development of treatment plan with patient or surrogate, discussions with consultants, evaluation of patient's response to treatment, examination of patient, obtaining history from patient or surrogate, ordering and performing treatments and interventions, ordering and review of laboratory studies, ordering and review of radiographic studies, pulse oximetry and re-evaluation of patient's condition     Medications Ordered in ED Medications  lactated ringers infusion ( Intravenous New Bag/Given 05/04/23 0006)  insulin aspart (novoLOG) injection 0-9 Units (has no administration in time range)  insulin aspart (novoLOG) injection 3 Units (has no administration in time range)  insulin glargine-yfgn (SEMGLEE) injection 16 Units (has no administration in time range)  ceFEPIme (MAXIPIME) 2 g in sodium chloride 0.9 % 100 mL IVPB (has no administration in time range)  acetaminophen (TYLENOL) tablet 650 mg (has no administration in time range)  acetaminophen (TYLENOL) tablet 650 mg (has no administration in time range)    Or  acetaminophen (TYLENOL) suppository 650 mg (has no administration in time range)  ondansetron (ZOFRAN) tablet 4 mg (has no administration in time range)    Or  ondansetron (ZOFRAN) injection 4 mg (has no administration in time range)  lactated ringers bolus 1,000 mL (0 mLs Intravenous Stopped 05/04/23 0136)  ceFEPIme (MAXIPIME) 2 g in sodium chloride 0.9 % 100 mL IVPB (0 g Intravenous Stopped 05/04/23 0035)    ED Course/ Medical Decision Making/ A&P Clinical Course as  of 05/04/23 0153  Fri May 04, 2023  1610 Consulted Dr. Julian Reil, hospital medicine is agreeable to admitting this patient to his service. [RS]    Clinical Course User Index [RS] Kalika Smay, Eugene Gavia, PA-C                            Medical Decision Making 37 year old female who presents with concern for fever in context of known neutropenia.   Patient is febrile 102 F, tachycardic on intake. Tachycardic with regular rhythm on exam, pulmonary exam is unremarkable, abdominal exam is benign.  Patient without any evidence of easy bruising or bleeding, no rashes.  Mild posterior pharyngeal erythema without exudate.   Concern for febrile neutropenia with recent ANC of 0.6 and known pancytopenia in context of treatment with Imuran for autoimmune hepatitis.   Amount and/or Complexity of Data Reviewed Labs: ordered.    Details: CBC with pancytopenia though improved from prior.  WBC up to ANC of 1.5, hemoglobin 8.1 and thrombocytopenia of 85.  INR mildly elevated to 1.5 and total bili elevated 2.7 consistent with patient's known history of autoimmune hepatitis not patient's baseline.  UA unremarkable, lactic acid is normal.  Radiology: ordered.    Details: Chest x-ray visualized this provider, negative for acute cardiopulmonary disease.  Agree with radiology interpretation. ECG/medicine tests:     Details:  EKG with sinus tachycardia, tachycardia mildly improved after fluid bolus.  Risk OTC drugs. Prescription drug management. Decision regarding hospitalization.   Patient administered cefepime to cover for febrile neutropenia, consult to hospitalist pending.   Linda [name] voiced understanding of her medical evaluation and treatment plan. Each of their questions answered to their expressed satisfaction.    Consult to Dr. Julian Reil, hosptialist who is agreeable to admitting this patient to his service.  This chart was dictated using voice recognition software, Dragon. Despite the best efforts of this  provider to proofread and correct errors, errors may still occur which can change documentation meaning.   Final Clinical Impression(s) / ED Diagnoses Final diagnoses:  Febrile  neutropenia [facility])    Rx / DC Orders ED Discharge Orders     None         Sherrilee Gilles 05/04/23 0153    Melene Plan, DO 05/04/23 2321

## 2023-05-03 NOTE — Sepsis Progress Note (Signed)
Following for sepsis monitoring ?

## 2023-05-04 ENCOUNTER — Encounter (HOSPITAL_COMMUNITY): Payer: Self-pay | Admitting: Internal Medicine

## 2023-05-04 DIAGNOSIS — Z91018 Allergy to other foods: Secondary | ICD-10-CM | POA: Diagnosis not present

## 2023-05-04 DIAGNOSIS — Z975 Presence of (intrauterine) contraceptive device: Secondary | ICD-10-CM | POA: Diagnosis not present

## 2023-05-04 DIAGNOSIS — D709 Neutropenia, unspecified: Secondary | ICD-10-CM | POA: Diagnosis present

## 2023-05-04 DIAGNOSIS — D702 Other drug-induced agranulocytosis: Secondary | ICD-10-CM | POA: Diagnosis present

## 2023-05-04 DIAGNOSIS — K754 Autoimmune hepatitis: Secondary | ICD-10-CM | POA: Diagnosis present

## 2023-05-04 DIAGNOSIS — Z794 Long term (current) use of insulin: Secondary | ICD-10-CM | POA: Diagnosis not present

## 2023-05-04 DIAGNOSIS — E109 Type 1 diabetes mellitus without complications: Secondary | ICD-10-CM | POA: Diagnosis not present

## 2023-05-04 DIAGNOSIS — Z1152 Encounter for screening for COVID-19: Secondary | ICD-10-CM | POA: Diagnosis not present

## 2023-05-04 DIAGNOSIS — K7460 Unspecified cirrhosis of liver: Secondary | ICD-10-CM | POA: Diagnosis present

## 2023-05-04 DIAGNOSIS — R5081 Fever presenting with conditions classified elsewhere: Secondary | ICD-10-CM | POA: Diagnosis present

## 2023-05-04 DIAGNOSIS — D61818 Other pancytopenia: Secondary | ICD-10-CM | POA: Diagnosis not present

## 2023-05-04 DIAGNOSIS — Z833 Family history of diabetes mellitus: Secondary | ICD-10-CM | POA: Diagnosis not present

## 2023-05-04 DIAGNOSIS — R000 Tachycardia, unspecified: Secondary | ICD-10-CM | POA: Diagnosis present

## 2023-05-04 DIAGNOSIS — Y92239 Unspecified place in hospital as the place of occurrence of the external cause: Secondary | ICD-10-CM | POA: Diagnosis present

## 2023-05-04 DIAGNOSIS — E119 Type 2 diabetes mellitus without complications: Secondary | ICD-10-CM

## 2023-05-04 DIAGNOSIS — Z79899 Other long term (current) drug therapy: Secondary | ICD-10-CM | POA: Diagnosis not present

## 2023-05-04 DIAGNOSIS — T451X5A Adverse effect of antineoplastic and immunosuppressive drugs, initial encounter: Secondary | ICD-10-CM | POA: Diagnosis present

## 2023-05-04 LAB — GLUCOSE, CAPILLARY
Glucose-Capillary: 175 mg/dL — ABNORMAL HIGH (ref 70–99)
Glucose-Capillary: 153 mg/dL — ABNORMAL HIGH (ref 70–99)
Glucose-Capillary: 201 mg/dL — ABNORMAL HIGH (ref 70–99)

## 2023-05-04 LAB — DIFFERENTIAL
Abs Immature Granulocytes: 0.01 10*3/uL (ref 0.00–0.07)
Basophils Absolute: 0 10*3/uL (ref 0.0–0.1)
Basophils Relative: 1 %
Eosinophils Absolute: 0.1 10*3/uL (ref 0.0–0.5)
Eosinophils Relative: 5 %
Immature Granulocytes: 1 %
Lymphocytes Relative: 17 %
Lymphs Abs: 0.4 10*3/uL — ABNORMAL LOW (ref 0.7–4.0)
Monocytes Absolute: 0.2 10*3/uL (ref 0.1–1.0)
Monocytes Relative: 8 %
Neutro Abs: 1.5 10*3/uL — ABNORMAL LOW (ref 1.7–7.7)
Neutrophils Relative %: 68 %

## 2023-05-04 LAB — CBC WITH DIFFERENTIAL/PLATELET
Abs Immature Granulocytes: 0.02 10*3/uL (ref 0.00–0.07)
Basophils Absolute: 0 10*3/uL (ref 0.0–0.1)
Basophils Relative: 1 %
Eosinophils Absolute: 0.1 10*3/uL (ref 0.0–0.5)
Eosinophils Relative: 3 %
HCT: 24 % — ABNORMAL LOW (ref 36.0–46.0)
Hemoglobin: 8.1 g/dL — ABNORMAL LOW (ref 12.0–15.0)
Immature Granulocytes: 1 %
Lymphocytes Relative: 11 %
Lymphs Abs: 0.2 10*3/uL — ABNORMAL LOW (ref 0.7–4.0)
MCH: 39.9 pg — ABNORMAL HIGH (ref 26.0–34.0)
MCHC: 33.8 g/dL (ref 30.0–36.0)
MCV: 118.2 fL — ABNORMAL HIGH (ref 80.0–100.0)
Monocytes Absolute: 0.2 10*3/uL (ref 0.1–1.0)
Monocytes Relative: 10 %
Neutro Abs: 1.5 10*3/uL — ABNORMAL LOW (ref 1.7–7.7)
Neutrophils Relative %: 74 %
Platelets: 85 10*3/uL — ABNORMAL LOW (ref 150–400)
RBC: 2.03 MIL/uL — ABNORMAL LOW (ref 3.87–5.11)
RDW: 17.1 % — ABNORMAL HIGH (ref 11.5–15.5)
WBC: 2 10*3/uL — ABNORMAL LOW (ref 4.0–10.5)
nRBC: 0 % (ref 0.0–0.2)

## 2023-05-04 LAB — URINALYSIS, W/ REFLEX TO CULTURE (INFECTION SUSPECTED)
Bacteria, UA: NONE SEEN
Bilirubin Urine: NEGATIVE
Glucose, UA: >=500 mg/dL — AB
Hgb urine dipstick: NEGATIVE
Ketones, ur: NEGATIVE mg/dL
Leukocytes,Ua: NEGATIVE
Nitrite: NEGATIVE
Protein, ur: NEGATIVE mg/dL
Specific Gravity, Urine: 1.008 (ref 1.005–1.030)
pH: 5 (ref 5.0–8.0)

## 2023-05-04 LAB — IRON AND TIBC
Iron: 89 ug/dL (ref 28–170)
Saturation Ratios: 34 % — ABNORMAL HIGH (ref 10.4–31.8)
TIBC: 259 ug/dL (ref 250–450)
UIBC: 170 ug/dL

## 2023-05-04 LAB — CBC
HCT: 23.4 % — ABNORMAL LOW (ref 36.0–46.0)
HCT: 24.5 % — ABNORMAL LOW (ref 36.0–46.0)
Hemoglobin: 7.7 g/dL — ABNORMAL LOW (ref 12.0–15.0)
Hemoglobin: 8.1 g/dL — ABNORMAL LOW (ref 12.0–15.0)
MCH: 39.3 pg — ABNORMAL HIGH (ref 26.0–34.0)
MCH: 40.1 pg — ABNORMAL HIGH (ref 26.0–34.0)
MCHC: 32.9 g/dL (ref 30.0–36.0)
MCHC: 33.1 g/dL (ref 30.0–36.0)
MCV: 119.4 fL — ABNORMAL HIGH (ref 80.0–100.0)
MCV: 121.3 fL — ABNORMAL HIGH (ref 80.0–100.0)
Platelets: 76 10*3/uL — ABNORMAL LOW (ref 150–400)
Platelets: 78 10*3/uL — ABNORMAL LOW (ref 150–400)
RBC: 1.96 MIL/uL — ABNORMAL LOW (ref 3.87–5.11)
RBC: 2.02 MIL/uL — ABNORMAL LOW (ref 3.87–5.11)
RDW: 17.2 % — ABNORMAL HIGH (ref 11.5–15.5)
RDW: 17.1 % — ABNORMAL HIGH (ref 11.5–15.5)
WBC: 2.1 10*3/uL — ABNORMAL LOW (ref 4.0–10.5)
WBC: 2.1 10*3/uL — ABNORMAL LOW (ref 4.0–10.5)
nRBC: 0 % (ref 0.0–0.2)
nRBC: 0 % (ref 0.0–0.2)

## 2023-05-04 LAB — COMPREHENSIVE METABOLIC PANEL
ALT: 31 U/L (ref 0–44)
AST: 37 U/L (ref 15–41)
Albumin: 3.7 g/dL (ref 3.5–5.0)
Alkaline Phosphatase: 88 U/L (ref 38–126)
Anion gap: 7 (ref 5–15)
BUN: 11 mg/dL (ref 6–20)
CO2: 21 mmol/L — ABNORMAL LOW (ref 22–32)
Calcium: 8.2 mg/dL — ABNORMAL LOW (ref 8.9–10.3)
Chloride: 107 mmol/L (ref 98–111)
Creatinine, Ser: 0.62 mg/dL (ref 0.44–1.00)
GFR, Estimated: >60 mL/min (ref >60)
Glucose, Bld: 275 mg/dL — ABNORMAL HIGH (ref 70–99)
Potassium: 4 mmol/L (ref 3.5–5.1)
Sodium: 135 mmol/L (ref 135–145)
Total Bilirubin: 2.7 mg/dL — ABNORMAL HIGH (ref 0.3–1.2)
Total Protein: 6.6 g/dL (ref 6.5–8.1)

## 2023-05-04 LAB — BASIC METABOLIC PANEL
Anion gap: 8 (ref 5–15)
BUN: 8 mg/dL (ref 6–20)
CO2: 22 mmol/L (ref 22–32)
Calcium: 8.2 mg/dL — ABNORMAL LOW (ref 8.9–10.3)
Chloride: 107 mmol/L (ref 98–111)
Creatinine, Ser: 0.46 mg/dL (ref 0.44–1.00)
GFR, Estimated: >60 mL/min (ref >60)
Glucose, Bld: 187 mg/dL — ABNORMAL HIGH (ref 70–99)
Potassium: 3.7 mmol/L (ref 3.5–5.1)
Sodium: 137 mmol/L (ref 135–145)

## 2023-05-04 LAB — LACTIC ACID, PLASMA
Lactic Acid, Venous: 1 mmol/L (ref 0.5–1.9)
Lactic Acid, Venous: 1.4 mmol/L (ref 0.5–1.9)

## 2023-05-04 LAB — HCG, QUANTITATIVE, PREGNANCY: hCG, Beta Chain, Quant, S: <1 m[IU]/mL (ref <5)

## 2023-05-04 LAB — HEMOGLOBIN A1C
Hgb A1c MFr Bld: 4.9 % (ref 4.8–5.6)
Mean Plasma Glucose: 93.93 mg/dL

## 2023-05-04 LAB — VITAMIN B12: Vitamin B-12: 629 pg/mL (ref 180–914)

## 2023-05-04 LAB — HIV ANTIBODY (ROUTINE TESTING W REFLEX): HIV Screen 4th Generation wRfx: NONREACTIVE

## 2023-05-04 LAB — FOLATE: Folate: 16 ng/mL (ref >5.9)

## 2023-05-04 LAB — APTT: aPTT: 35 seconds (ref 24–36)

## 2023-05-04 LAB — PROTIME-INR
INR: 1.5 — ABNORMAL HIGH (ref 0.8–1.2)
Prothrombin Time: 18.2 seconds — ABNORMAL HIGH (ref 11.4–15.2)

## 2023-05-04 LAB — FERRITIN: Ferritin: 28 ng/mL (ref 11–307)

## 2023-05-04 LAB — TECHNOLOGIST SMEAR REVIEW

## 2023-05-04 LAB — SARS CORONAVIRUS 2 BY RT PCR: SARS Coronavirus 2 by RT PCR: NEGATIVE

## 2023-05-04 LAB — CBG MONITORING, ED: Glucose-Capillary: 143 mg/dL — ABNORMAL HIGH (ref 70–99)

## 2023-05-04 MED ORDER — INSULIN ASPART 100 UNIT/ML IJ SOLN
3.0000 [IU] | Freq: Three times a day (TID) | INTRAMUSCULAR | Status: DC
  Administered 2023-05-04 – 2023-05-05 (×2): 3 [IU] via SUBCUTANEOUS
  Filled 2023-05-04: qty 0.03

## 2023-05-04 MED ORDER — ACETAMINOPHEN 650 MG RE SUPP: 650.0000 mg | Freq: Four times a day (QID) | RECTAL | Status: DC | PRN

## 2023-05-04 MED ORDER — ACETAMINOPHEN 325 MG PO TABS
650.0000 mg | ORAL_TABLET | Freq: Once | ORAL | Status: AC
  Administered 2023-05-04: 650 mg via ORAL
  Filled 2023-05-04: qty 2

## 2023-05-04 MED ORDER — SODIUM CHLORIDE 0.9 % IV SOLN
2.0000 g | Freq: Three times a day (TID) | INTRAVENOUS | Status: DC
  Administered 2023-05-04 – 2023-05-05 (×4): 2 g via INTRAVENOUS
  Filled 2023-05-04 (×5): qty 12.5

## 2023-05-04 MED ORDER — ONDANSETRON HCL 4 MG/2ML IJ SOLN: 4.0000 mg | Freq: Four times a day (QID) | INTRAMUSCULAR | Status: DC | PRN

## 2023-05-04 MED ORDER — INSULIN ASPART 100 UNIT/ML IJ SOLN
0.0000 [IU] | Freq: Three times a day (TID) | INTRAMUSCULAR | Status: DC
  Administered 2023-05-04: 2 [IU] via SUBCUTANEOUS
  Filled 2023-05-04: qty 0.09

## 2023-05-04 MED ORDER — INSULIN GLARGINE (1 UNIT DIAL) 300 UNIT/ML ~~LOC~~ SOPN: 16.0000 [IU] | PEN_INJECTOR | Freq: Every day | SUBCUTANEOUS | Status: DC

## 2023-05-04 MED ORDER — ACETAMINOPHEN 325 MG PO TABS: 650.0000 mg | ORAL_TABLET | Freq: Four times a day (QID) | ORAL | Status: DC | PRN

## 2023-05-04 MED ORDER — INSULIN GLARGINE-YFGN 100 UNIT/ML ~~LOC~~ SOLN
16.0000 [IU] | Freq: Every day | SUBCUTANEOUS | Status: DC
  Administered 2023-05-04: 16 [IU] via SUBCUTANEOUS
  Filled 2023-05-04 (×4): qty 0.16

## 2023-05-04 MED ORDER — ONDANSETRON HCL 4 MG PO TABS: 4.0000 mg | ORAL_TABLET | Freq: Four times a day (QID) | ORAL | Status: DC | PRN

## 2023-05-04 NOTE — H&P (Signed)
History and Physical    Patient: Linda [name] ZOX:[id number] DOB: [birth year] DOA: 05/03/2023 DOS: the patient was seen and examined on 05/04/2023 PCP: [name], PA-C  Patient coming from: Home  Chief Complaint:  Chief Complaint  Patient presents with   Fever   Generalized Body Aches   HPI: Linda [name] is a 37 y.o. female with medical history significant of IDDM, autoimmune hepatitis.  Pt on Imuran for treatment of autoimmune hepatitis previously.  Had pancytopenia for much of late 2023 it looks like based on [facility] CBC data.  TMPT level drawn in Oct 2023 was wnl.  Pancytopenia became more severe around 04/14/23 with ANC of 600.  Imuran was stopped.  Pt had been asymptomatic until about 7pm when she developed fever 103, body aches, dry cough, diarrhea.  No melena, no hematochezia.  Cough is non-productive.  No known sick contacts.   Review of Systems: As mentioned in the history of present illness. All other systems reviewed and are negative. Past Medical History:  Diagnosis Date   Diabetes mellitus without complication (HCC)    Hepatitis    No past surgical history on file. Social History:  reports that she has never smoked. She has never used smokeless tobacco. She reports current alcohol use. She reports that she does not use drugs.  Allergies  Allergen Reactions   Other Shortness Of Breath    Tree nuts    Family History  Problem Relation Age of Onset   Diabetes Sister     Prior to Admission medications   Medication Sig Start Date End Date Taking? Authorizing Provider  ibuprofen (ADVIL) 200 MG tablet Take 200 mg by mouth every 6 (six) hours as needed for fever.   Yes [provider]  Insulin Lispro-aabc (LYUMJEV KWIKPEN) 100 UNIT/ML KwikPen INJECT 1 UNIT UNDER THE SKIN PER 15 GRAMS CARBOHYDRATES PLUS SLIDING SCALE. ADD 1 UNIT PER GLUCOSE 50 ABOVE 150. TOTAL 30 UNITS/DAY. 12/28/21  Yes [provider]  levonorgestrel (MIRENA, 52 MG,) 20 MCG/24HR IUD  Mirena 20 mcg/24 hours (7 yrs) 52 mg intrauterine device  Take by intrauterine route. 10/16/17  Yes [provider]  TOUJEO SOLOSTAR 300 UNIT/ML Solostar Pen Inject 16 Units into the skin at bedtime.   Yes [provider]    Physical Exam: Vitals:   05/03/23 2130 05/03/23 2137 05/04/23 0000  BP: (!) 150/82  119/81  Pulse: (!) 124  (!) 112  Resp: 18  16  Temp: (!) 102.2 F (39 C)    TempSrc: Oral    SpO2: 100%  98%  Weight:  74.8 kg   Height:  5\' 10"  (1.778 m)    Constitutional: NAD, calm, comfortable Respiratory: clear to auscultation bilaterally, no wheezing, no crackles. Normal respiratory effort. No accessory muscle use.  Cardiovascular: tachycardic Abdomen: no tenderness, no masses palpated. No hepatosplenomegaly. Bowel sounds positive.  Neurologic: CN 2-12 grossly intact. Sensation intact, DTR normal. Strength 5/5 in all 4.  Psychiatric: Normal judgment and insight. Alert and oriented x 3. Normal mood.   Data Reviewed:    Labs on Admission: I have personally reviewed following labs and imaging studies  CBC: Recent Labs  Lab 05/03/23 2339  WBC 2.0*  NEUTROABS 1.5*  HGB 8.1*  HCT 24.0*  MCV 118.2*  PLT 85*   Basic Metabolic Panel: Recent Labs  Lab 05/03/23 2339  NA 135  K 4.0  CL 107  CO2 21*  GLUCOSE 275*  BUN 11  CREATININE 0.62  CALCIUM 8.2*  GFR: Estimated Creatinine Clearance: 107.2 mL/min (by C-G formula based on SCr of 0.62 mg/dL). Liver Function Tests: Recent Labs  Lab 05/03/23 2339  AST 37  ALT 31  ALKPHOS 88  BILITOT 2.7*  PROT 6.6  ALBUMIN 3.7   No results for input(s): "LIPASE", "AMYLASE" in the last 168 hours. No results for input(s): "AMMONIA" in the last 168 hours. Coagulation Profile: Recent Labs  Lab 05/03/23 2339  INR 1.5*   Cardiac Enzymes: No results for input(s): "CKTOTAL", "CKMB", "CKMBINDEX", "TROPONINI" in the last 168 hours. BNP (last 3 results) No results for input(s): "PROBNP" in the last  8760 hours. HbA1C: No results for input(s): "HGBA1C" in the last 72 hours. CBG: No results for input(s): "GLUCAP" in the last 168 hours. Lipid Profile: No results for input(s): "CHOL", "HDL", "LDLCALC", "TRIG", "CHOLHDL", "LDLDIRECT" in the last 72 hours. Thyroid Function Tests: No results for input(s): "TSH", "T4TOTAL", "FREET4", "T3FREE", "THYROIDAB" in the last 72 hours. Anemia Panel: No results for input(s): "VITAMINB12", "FOLATE", "FERRITIN", "TIBC", "IRON", "RETICCTPCT" in the last 72 hours. Urine analysis:    Component Value Date/Time   COLORURINE YELLOW 05/04/2023 0015   APPEARANCEUR CLEAR 05/04/2023 0015   LABSPEC 1.008 05/04/2023 0015   PHURINE 5.0 05/04/2023 0015   GLUCOSEU >=500 (A) 05/04/2023 0015   HGBUR NEGATIVE 05/04/2023 0015   BILIRUBINUR NEGATIVE 05/04/2023 0015   KETONESUR NEGATIVE 05/04/2023 0015   PROTEINUR NEGATIVE 05/04/2023 0015   NITRITE NEGATIVE 05/04/2023 0015   LEUKOCYTESUR NEGATIVE 05/04/2023 0015    Radiological Exams on Admission: DG Chest Port 1 View  Result Date: 05/03/2023 CLINICAL DATA:  Fever body ache EXAM: PORTABLE CHEST 1 VIEW COMPARISON:  11/23/2016 FINDINGS: The heart size and mediastinal contours are within normal limits. Both lungs are clear. The visualized skeletal structures are unremarkable. IMPRESSION: No active disease. Electronically Signed   By: Jasmine Pang M.D.   On: 05/03/2023 22:29    EKG: Independently reviewed.   Assessment and Plan: * Febrile neutropenia (HCC) ANC = 1500, with fever.  Pancytopenia is actually improved compared to earlier this month (6/1) labs. Empiric cefepime Warrants heme/onc consult - will send secure chat to on call provider. TPMT was normal (27.2) when tested at OSH x8 months ago. Will defer to heme/onc if retesting is indicated Dont see [id number] results in care everywhere, will try to order this as a misc lab. Actually, looks like she's had some degree of neutropenia / pancytopenia going back at  least 1 year now.  Pancytopenia (HCC) Imuran induced? Looks like this ongoing for the past ~1 year or so on all the lab work at [facility] in 2023.  Not ongoing in 2022. Became more severe around 04/14/23 and so Imuran stopped.  Now back to where her numbers had been running for much of the latter part of 2023 in the [facility] system. Imuran on hold TPMT normal on [facility] labs in Oct 2023 Trying to order [id number] as a misc lab test.  Dont see that this was obtained at [facility]. Heme onc consult as per above.  Autoimmune hepatitis (HCC) Previously on imuran, imuran now on hold due to severe pancytopenia discovered on lab work earlier this month.  DM (diabetes mellitus) (HCC) Cont home glargine 16u at bedtime 3u mealtime Sensitive [facility]      Advance Care Planning:   Code Status: Full Code  Consults: Secure chat sent to Dr. Leonides Schanz  Family Communication: No family in room  Severity of Illness: The appropriate patient status for this patient is  INPATIENT. Inpatient status is judged to be reasonable and necessary in order to provide the required intensity of service to ensure the patient's safety. The patient's presenting symptoms, physical exam findings, and initial radiographic and laboratory data in the context of their chronic comorbidities is felt to place them at high risk for further clinical deterioration. Furthermore, it is not anticipated that the patient will be medically stable for discharge from the hospital within 2 midnights of admission.   * I certify that at the point of admission it is my clinical judgment that the patient will require inpatient hospital care spanning beyond 2 midnights from the point of admission due to high intensity of service, high risk for further deterioration and high frequency of surveillance required.*  Author: Hillary Bow., DO 05/04/2023 1:47 AM  For on call review [email or web address].

## 2023-05-04 NOTE — ED Notes (Signed)
ED TO INPATIENT HANDOFF REPORT  Name/Age/Gender Linda [name] 37 y.o. female  Code Status    Code Status Orders  (From admission, onward)           Start     Ordered   05/04/23 0148  Full code  Continuous       Question:  By:  Answer:  Consent: discussion documented in EHR   05/04/23 0149           Code Status History     This patient has a current code status but no historical code status.       Home/SNF/Other Home  Chief Complaint Febrile neutropenia (HCC) [D70.9, R50.81]  Level of Care/Admitting Diagnosis ED Disposition     ED Disposition  Admit   Condition  --   Comment  Hospital Area: [facility] [[id number]]  Level of Care: Med-Surg [16]  May admit patient to [facility] or [facility] if equivalent level of care is available:: No  Covid Evaluation: Asymptomatic - no recent exposure (last 10 days) testing not required  Diagnosis: Febrile neutropenia [facility]) [[id number]]  Admitting Physician: Hillary Bow [id number]  Attending Physician: Hillary Bow [id number]  Certification:: I certify this patient will need inpatient services for at least 2 midnights  Estimated Length of Stay: 4          Medical History Past Medical History:  Diagnosis Date   Diabetes mellitus without complication (HCC)    Hepatitis     Allergies Allergies  Allergen Reactions   Other Shortness Of Breath    Tree nuts    IV Location/Drains/Wounds Patient Lines/Drains/Airways Status     Active Line/Drains/Airways     Name Placement date Placement time Site Days   Peripheral IV 05/03/23 20 G 1" Anterior;Distal;Right;Upper Arm 05/03/23  2335  Arm  1            Labs/Imaging Results for orders placed or performed during the hospital encounter of 05/03/23 (from the past 48 hour(s))  Lactic acid, plasma     Status: None   Collection Time: 05/03/23 11:39 PM  Result Value Ref Range   Lactic Acid, Venous 1.4 0.5 - 1.9 mmol/L    Comment: Performed at  [facility], [address], [place], [place] [address]  Comprehensive metabolic panel     Status: Abnormal   Collection Time: 05/03/23 11:39 PM  Result Value Ref Range   Sodium 135 135 - 145 mmol/L   Potassium 4.0 3.5 - 5.1 mmol/L   Chloride 107 98 - 111 mmol/L   CO2 21 (L) 22 - 32 mmol/L   Glucose, Bld 275 (H) 70 - 99 mg/dL    Comment: Glucose reference range applies only to samples taken after fasting for at least 8 hours.   BUN 11 6 - 20 mg/dL   Creatinine, Ser 8.65 0.44 - 1.00 mg/dL   Calcium 8.2 (L) 8.9 - 10.3 mg/dL   Total Protein 6.6 6.5 - 8.1 g/dL   Albumin 3.7 3.5 - 5.0 g/dL   AST 37 15 - 41 U/L   ALT 31 0 - 44 U/L   Alkaline Phosphatase 88 38 - 126 U/L   Total Bilirubin 2.7 (H) 0.3 - 1.2 mg/dL   GFR, Estimated >78 >46 mL/min    Comment: (NOTE) Calculated using the CKD-EPI Creatinine Equation (2021)    Anion gap 7 5 - 15    Comment: Performed at [facility], [address]  [place], [place] [address]  CBC with Differential     Status: Abnormal   Collection Time: 05/03/23 11:39 PM  Result Value Ref Range   WBC 2.0 (L) 4.0 - 10.5 K/uL   RBC 2.03 (L) 3.87 - 5.11 MIL/uL   Hemoglobin 8.1 (L) 12.0 - 15.0 g/dL   HCT 60.4 (L) 54.0 - 98.1 %   MCV 118.2 (H) 80.0 - 100.0 fL   MCH 39.9 (H) 26.0 - 34.0 pg   MCHC 33.8 30.0 - 36.0 g/dL   RDW 19.1 (H) 47.8 - 29.5 %   Platelets 85 (L) 150 - 400 K/uL    Comment: Immature Platelet Fraction may be clinically indicated, consider ordering this additional test [id number]    nRBC 0.0 0.0 - 0.2 %   Neutrophils Relative % 74 %   Neutro Abs 1.5 (L) 1.7 - 7.7 K/uL   Lymphocytes Relative 11 %   Lymphs Abs 0.2 (L) 0.7 - 4.0 K/uL   Monocytes Relative 10 %   Monocytes Absolute 0.2 0.1 - 1.0 K/uL   Eosinophils Relative 3 %   Eosinophils Absolute 0.1 0.0 - 0.5 K/uL   Basophils Relative 1 %   Basophils Absolute 0.0 0.0 - 0.1 K/uL   Immature Granulocytes 1 %   Abs Immature Granulocytes 0.02 0.00 -  0.07 K/uL    Comment: Performed at [facility], [address] [place], [place] [address]  Protime-INR     Status: Abnormal   Collection Time: 05/03/23 11:39 PM  Result Value Ref Range   Prothrombin Time 18.2 (H) 11.4 - 15.2 seconds   INR 1.5 (H) 0.8 - 1.2    Comment: (NOTE) INR goal varies based on device and disease states. Performed at [facility], [address], [place], [place] [address]   APTT     Status: None   Collection Time: 05/03/23 11:39 PM  Result Value Ref Range   aPTT 35 24 - 36 seconds    Comment: Performed at [facility], [address], [place], [place] [address]  hCG, quantitative, pregnancy     Status: None   Collection Time: 05/03/23 11:39 PM  Result Value Ref Range   hCG, Beta Chain, Quant, S <1 <5 mIU/mL    Comment:          GEST. AGE      CONC.  (mIU/mL)   <=1 WEEK        5 - 50     2 WEEKS       50 - 500     3 WEEKS       100 - 10,000     4 WEEKS     1,000 - 30,000     5 WEEKS     3,500 - 115,000   6-8 WEEKS     12,000 - 270,000    12 WEEKS     15,000 - 220,000        FEMALE AND NON-PREGNANT FEMALE:     LESS THAN 5 mIU/mL Performed at [facility], [address], [place], [place] [address]   Urinalysis, w/ Reflex to Culture (Infection Suspected) -Urine, Clean Catch     Status: Abnormal   Collection Time: 05/04/23 12:15 AM  Result Value Ref Range   Specimen Source URINE, CLEAN CATCH    Color, Urine YELLOW YELLOW   APPearance CLEAR CLEAR   Specific Gravity, Urine 1.008 1.005 - 1.030   pH 5.0 5.0 - 8.0  Glucose, UA >=500 (A) NEGATIVE mg/dL   Hgb urine dipstick NEGATIVE NEGATIVE   Bilirubin Urine NEGATIVE NEGATIVE   Ketones, ur NEGATIVE NEGATIVE mg/dL   Protein, ur NEGATIVE NEGATIVE mg/dL   Nitrite NEGATIVE NEGATIVE   Leukocytes,Ua NEGATIVE NEGATIVE   RBC / HPF 0-5 0 - 5 RBC/hpf   WBC, UA 0-5 0 - 5 WBC/hpf    Comment:        Reflex urine culture not performed if  WBC <=10, OR if Squamous epithelial cells >5. If Squamous epithelial cells >5 suggest recollection.    Bacteria, UA NONE SEEN NONE SEEN   Squamous Epithelial / HPF 0-5 0 - 5 /HPF   Mucus PRESENT     Comment: Performed at [facility], [address], [place], [place] [address]  SARS Coronavirus 2 by RT PCR (hospital order, performed in [facility] hospital lab) *cepheid single result test*     Status: None   Collection Time: 05/04/23  3:45 AM   Specimen: Nasal Swab  Result Value Ref Range   SARS Coronavirus 2 by RT PCR NEGATIVE NEGATIVE    Comment: (NOTE) SARS-CoV-2 target nucleic acids are NOT DETECTED.  The SARS-CoV-2 RNA is generally detectable in upper and lower respiratory specimens during the acute phase of infection. The lowest concentration of SARS-CoV-2 viral copies this assay can detect is 250 copies / mL. A negative result does not preclude SARS-CoV-2 infection and should not be used as the sole basis for treatment or other patient management decisions.  A negative result may occur with improper specimen collection / handling, submission of specimen other than nasopharyngeal swab, presence of viral mutation(s) within the areas targeted by this assay, and inadequate number of viral copies (<250 copies / mL). A negative result must be combined with clinical observations, patient history, and epidemiological information.  Fact Sheet for Patients:   RoadLapTop.co.za  Fact Sheet for Healthcare Providers: http://kim-miller.com/  This test is not yet approved or  cleared by the [place] FDA and has been authorized for detection and/or diagnosis of SARS-CoV-2 by FDA under an Emergency Use Authorization (EUA).  This EUA will remain in effect (meaning this test can be used) for the duration of the COVID-19 declaration under Section 564(b)(1) of the Act, 21 U.S.C. section 360bbb-3(b)(1), unless the  authorization is terminated or revoked sooner.  Performed at [facility], [address], [place], [place] [address]   CBC     Status: Abnormal   Collection Time: 05/04/23  6:20 AM  Result Value Ref Range   WBC 2.1 (L) 4.0 - 10.5 K/uL   RBC 1.96 (L) 3.87 - 5.11 MIL/uL   Hemoglobin 7.7 (L) 12.0 - 15.0 g/dL   HCT 69.6 (L) 29.5 - 28.4 %   MCV 119.4 (H) 80.0 - 100.0 fL   MCH 39.3 (H) 26.0 - 34.0 pg   MCHC 32.9 30.0 - 36.0 g/dL   RDW 13.2 (H) 44.0 - 10.2 %   Platelets 76 (L) 150 - 400 K/uL    Comment: Immature Platelet Fraction may be clinically indicated, consider ordering this additional test [id number] CONSISTENT WITH PREVIOUS RESULT    nRBC 0.0 0.0 - 0.2 %    Comment: Performed at [facility], [address], [place], [place] [address]  Basic metabolic panel     Status: Abnormal   Collection Time: 05/04/23  6:20 AM  Result Value Ref Range   Sodium 137 135 - 145 mmol/L   Potassium 3.7  3.5 - 5.1 mmol/L   Chloride 107 98 - 111 mmol/L   CO2 22 22 - 32 mmol/L   Glucose, Bld 187 (H) 70 - 99 mg/dL    Comment: Glucose reference range applies only to samples taken after fasting for at least 8 hours.   BUN 8 6 - 20 mg/dL   Creatinine, Ser 4.78 0.44 - 1.00 mg/dL   Calcium 8.2 (L) 8.9 - 10.3 mg/dL   GFR, Estimated >29 >56 mL/min    Comment: (NOTE) Calculated using the CKD-EPI Creatinine Equation (2021)    Anion gap 8 5 - 15    Comment: Performed at [facility], [address] [place], [place] [address]  CBG monitoring, ED     Status: Abnormal   Collection Time: 05/04/23  7:36 AM  Result Value Ref Range   Glucose-Capillary 143 (H) 70 - 99 mg/dL    Comment: Glucose reference range applies only to samples taken after fasting for at least 8 hours.   DG Chest Port 1 View  Result Date: 05/03/2023 CLINICAL DATA:  Fever body ache EXAM: PORTABLE CHEST 1 VIEW COMPARISON:  11/23/2016 FINDINGS: The heart size and mediastinal  contours are within normal limits. Both lungs are clear. The visualized skeletal structures are unremarkable. IMPRESSION: No active disease. Electronically Signed   By: Jasmine Pang M.D.   On: 05/03/2023 22:29    Pending Labs Unresulted Labs (From admission, onward)     Start     Ordered   05/04/23 0500  HIV Antibody (routine testing w rflx)  (HIV Antibody (Routine testing w reflex) panel)  Once,   R        05/04/23 0259   05/04/23 0206  Miscellaneous [facility] test (send-out)  Once,   R       Question:  Test name / description:  Answer:  [id number]   05/04/23 0208   05/04/23 0136  Hemoglobin A1c  Once,   URGENT       Comments: To assess prior glycemic control    05/04/23 0135   05/04/23 0130  Lactic acid, plasma  Once,   R        05/04/23 0130   05/03/23 2212  Resp panel by RT-PCR (RSV, Flu A&B, Covid) Urine, Clean Catch  (Septic presentation on arrival (screening labs, nursing and treatment orders for obvious sepsis))  Once,   URGENT        05/03/23 2212   05/03/23 2212  Blood Culture (routine x 2)  (Septic presentation on arrival (screening labs, nursing and treatment orders for obvious sepsis))  BLOOD CULTURE X 2,   STAT      05/03/23 2212            Vitals/Pain Today's Vitals   05/04/23 0429 05/04/23 0429 05/04/23 0430 05/04/23 0618  BP:  118/64  110/64  Pulse:  90  86  Resp:  15  18  Temp:  99.1 F (37.3 C)  99.2 F (37.3 C)  TempSrc:    Oral  SpO2:  99%  98%  Weight:      Height:      PainSc: 4  4  4   0-No pain    Isolation Precautions Airborne and Contact precautions  Medications Medications  lactated ringers infusion ( Intravenous New Bag/Given 05/04/23 0006)  insulin aspart (novoLOG) injection 0-9 Units (has no administration in time range)  insulin aspart (novoLOG) injection 3 Units (has no administration in time range)  insulin glargine-yfgn (SEMGLEE) injection 16 Units (  16 Units Subcutaneous Patient Refused/Not Given 05/04/23 0313)  ceFEPIme (MAXIPIME) 2 g  in sodium chloride 0.9 % 100 mL IVPB (has no administration in time range)  acetaminophen (TYLENOL) tablet 650 mg (has no administration in time range)    Or  acetaminophen (TYLENOL) suppository 650 mg (has no administration in time range)  ondansetron (ZOFRAN) tablet 4 mg (has no administration in time range)    Or  ondansetron (ZOFRAN) injection 4 mg (has no administration in time range)  lactated ringers bolus 1,000 mL (0 mLs Intravenous Stopped 05/04/23 0136)  ceFEPIme (MAXIPIME) 2 g in sodium chloride 0.9 % 100 mL IVPB (0 g Intravenous Stopped 05/04/23 0035)  acetaminophen (TYLENOL) tablet 650 mg (650 mg Oral Given 05/04/23 0312)    Mobility walks

## 2023-05-04 NOTE — Progress Notes (Signed)
   05/04/23 0834  TOC Brief Assessment  Insurance and Status Reviewed  Patient has primary care physician Yes  Home environment has been reviewed Resides in single family home  Prior level of function: Independent at baseline  Prior/Current Home Services No current home services  Social Determinants of Health Reivew SDOH reviewed no interventions necessary  Readmission risk has been reviewed Yes  Transition of care needs no transition of care needs at this time

## 2023-05-04 NOTE — Assessment & Plan Note (Addendum)
-   Previously on imuran, imuran now on hold due to severe pancytopenia -Outpatient follow-up with hematology for dosage reduction and when to resume

## 2023-05-04 NOTE — Assessment & Plan Note (Addendum)
-   has remained neutropenic and pancytopenic since ~1 year - WBC has appeared to be around 1.5 - 2 but since 5/31 has dropped further - imuran discontinued on 6/1 and WBC has technically returned to prior values ~2 - possible viral cause for recent drop but it looks to be recovering back to at least prior values - may still take time for improvement as bone marrow may be overly suppressed; hematology will continue to follow outpatient -Iron stores and folate/B12 are appropriate

## 2023-05-04 NOTE — Progress Notes (Signed)
Progress Note    Linda [name]   OAC:[id number]  DOB: 05/10/1986  DOA: 05/03/2023     0 PCP: Linda Canary, PA-C  Initial CC: fever, aches  Hospital Course: 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  She is followed by GI with [facility] (Dr. Joellyn Rued).  She presented to the hospital with development of fevers and bodyaches. Imuran was recently stopped on 04/14/2023 after she developed severe neutropenia.  On workup, she was found to have improved but persistent neutropenia.  ANC 1500 on admission. Urinalysis negative for signs of infection.  CXR clear.  No obvious etiology for infectious source. Blood cultures were obtained and she was started on cefepime and admitted for further monitoring. Heme/onc was also consulted on admission as well.   Interval History:  Resting in bed when seen this morning.  Does not appear toxic.  Last documented fever was around 9:30 PM, 102.2. No further aches.    Assessment and Plan: * Febrile neutropenia (HCC) - on admission, ANC = 1500, with fever - Pancytopenia is actually improved compared to earlier this month (6/1) labs -Continue cefepime.  Follow-up blood cultures - TPMT level 27.2 on 08/18/22; repeat level ordered no admission as well as [id number] - follow up further heme/onc rec's  Pancytopenia (HCC) - has remained neutropenic and pancytopenic since ~1 year - WBC has appeared to be around 1.5 - 2 but since 5/31 has dropped further - imuran discontinued on 6/1 and WBC has technically returned to prior values ~2 - possible viral cause for recent drop but it looks to be recovering back to at least prior values - checking iron studies and folate/B12 - follow up any further heme/onc rec's  Autoimmune hepatitis (HCC) - Previously on imuran, imuran now on hold due to severe pancytopenia  DM (diabetes mellitus) (HCC) - continue basal insulin - continue SSI and CBGs - A1c 4.9% on  05/04/23   Old records reviewed in assessment of this patient  Antimicrobials: Cefepime 05/03/2023 >> current  DVT prophylaxis:  SCDs Start: 05/04/23 0148   Code Status:   Code Status: Full Code  Mobility Assessment (last 72 hours)     Mobility Assessment     Row Name 05/04/23 0933           Does patient have an order for bedrest or is patient medically unstable No - Continue assessment       What is the highest level of mobility based on the progressive mobility assessment? Level 6 (Walks independently in room and hall) - Balance while walking in room without assist - Complete                Barriers to discharge: none Disposition Plan:  Home Status is: Inpt  Objective: Blood pressure 127/69, pulse 100, temperature 99 F (37.2 C), temperature source Oral, resp. rate 18, height 5\' 10"  (1.778 m), weight 74.8 kg, SpO2 100 %.  Examination:  Physical Exam Constitutional:      General: She is not in acute distress.    Appearance: Normal appearance. She is not ill-appearing.  HENT:     Head: Normocephalic and atraumatic.     Mouth/Throat:     Mouth: Mucous membranes are moist.  Eyes:     Extraocular Movements: Extraocular movements intact.  Cardiovascular:     Rate and Rhythm: Normal rate and regular rhythm.  Pulmonary:     Effort: Pulmonary effort is normal. No  respiratory distress.     Breath sounds: Normal breath sounds. No wheezing.  Abdominal:     General: Bowel sounds are normal. There is no distension.     Palpations: Abdomen is soft.     Tenderness: There is no abdominal tenderness.  Musculoskeletal:        General: No swelling. Normal range of motion.     Cervical back: Normal range of motion and neck supple.  Skin:    General: Skin is warm and dry.  Neurological:     General: No focal deficit present.     Mental Status: She is alert.  Psychiatric:        Mood and Affect: Mood normal.        Behavior: Behavior normal.      Consultants:   Heme/onc  Procedures:    Data Reviewed: Results for orders placed or performed during the hospital encounter of 05/03/23 (from the past 24 hour(s))  Lactic acid, plasma     Status: None   Collection Time: 05/03/23 11:39 PM  Result Value Ref Range   Lactic Acid, Venous 1.4 0.5 - 1.9 mmol/L  Comprehensive metabolic panel     Status: Abnormal   Collection Time: 05/03/23 11:39 PM  Result Value Ref Range   Sodium 135 135 - 145 mmol/L   Potassium 4.0 3.5 - 5.1 mmol/L   Chloride 107 98 - 111 mmol/L   CO2 21 (L) 22 - 32 mmol/L   Glucose, Bld 275 (H) 70 - 99 mg/dL   BUN 11 6 - 20 mg/dL   Creatinine, Ser 1.61 0.44 - 1.00 mg/dL   Calcium 8.2 (L) 8.9 - 10.3 mg/dL   Total Protein 6.6 6.5 - 8.1 g/dL   Albumin 3.7 3.5 - 5.0 g/dL   AST 37 15 - 41 U/L   ALT 31 0 - 44 U/L   Alkaline Phosphatase 88 38 - 126 U/L   Total Bilirubin 2.7 (H) 0.3 - 1.2 mg/dL   GFR, Estimated >09 >60 mL/min   Anion gap 7 5 - 15  CBC with Differential     Status: Abnormal   Collection Time: 05/03/23 11:39 PM  Result Value Ref Range   WBC 2.0 (L) 4.0 - 10.5 K/uL   RBC 2.03 (L) 3.87 - 5.11 MIL/uL   Hemoglobin 8.1 (L) 12.0 - 15.0 g/dL   HCT 45.4 (L) 09.8 - 11.9 %   MCV 118.2 (H) 80.0 - 100.0 fL   MCH 39.9 (H) 26.0 - 34.0 pg   MCHC 33.8 30.0 - 36.0 g/dL   RDW 14.7 (H) 82.9 - 56.2 %   Platelets 85 (L) 150 - 400 K/uL   nRBC 0.0 0.0 - 0.2 %   Neutrophils Relative % 74 %   Neutro Abs 1.5 (L) 1.7 - 7.7 K/uL   Lymphocytes Relative 11 %   Lymphs Abs 0.2 (L) 0.7 - 4.0 K/uL   Monocytes Relative 10 %   Monocytes Absolute 0.2 0.1 - 1.0 K/uL   Eosinophils Relative 3 %   Eosinophils Absolute 0.1 0.0 - 0.5 K/uL   Basophils Relative 1 %   Basophils Absolute 0.0 0.0 - 0.1 K/uL   Immature Granulocytes 1 %   Abs Immature Granulocytes 0.02 0.00 - 0.07 K/uL  Protime-INR     Status: Abnormal   Collection Time: 05/03/23 11:39 PM  Result Value Ref Range   Prothrombin Time 18.2 (H) 11.4 - 15.2 seconds   INR 1.5 (H) 0.8 - 1.2   APTT  Status: None   Collection Time: 05/03/23 11:39 PM  Result Value Ref Range   aPTT 35 24 - 36 seconds  hCG, quantitative, pregnancy     Status: None   Collection Time: 05/03/23 11:39 PM  Result Value Ref Range   hCG, Beta Chain, Quant, S <1 <5 mIU/mL  Urinalysis, w/ Reflex to Culture (Infection Suspected) -Urine, Clean Catch     Status: Abnormal   Collection Time: 05/04/23 12:15 AM  Result Value Ref Range   Specimen Source URINE, CLEAN CATCH    Color, Urine YELLOW YELLOW   APPearance CLEAR CLEAR   Specific Gravity, Urine 1.008 1.005 - 1.030   pH 5.0 5.0 - 8.0   Glucose, UA >=500 (A) NEGATIVE mg/dL   Hgb urine dipstick NEGATIVE NEGATIVE   Bilirubin Urine NEGATIVE NEGATIVE   Ketones, ur NEGATIVE NEGATIVE mg/dL   Protein, ur NEGATIVE NEGATIVE mg/dL   Nitrite NEGATIVE NEGATIVE   Leukocytes,Ua NEGATIVE NEGATIVE   RBC / HPF 0-5 0 - 5 RBC/hpf   WBC, UA 0-5 0 - 5 WBC/hpf   Bacteria, UA NONE SEEN NONE SEEN   Squamous Epithelial / HPF 0-5 0 - 5 /HPF   Mucus PRESENT   SARS Coronavirus 2 by RT PCR (hospital order, performed in [facility] hospital lab) *cepheid single result test*     Status: None   Collection Time: 05/04/23  3:45 AM   Specimen: Nasal Swab  Result Value Ref Range   SARS Coronavirus 2 by RT PCR NEGATIVE NEGATIVE  Hemoglobin A1c     Status: None   Collection Time: 05/04/23  6:20 AM  Result Value Ref Range   Hgb A1c MFr Bld 4.9 4.8 - 5.6 %   Mean Plasma Glucose 93.93 mg/dL  CBC     Status: Abnormal   Collection Time: 05/04/23  6:20 AM  Result Value Ref Range   WBC 2.1 (L) 4.0 - 10.5 K/uL   RBC 1.96 (L) 3.87 - 5.11 MIL/uL   Hemoglobin 7.7 (L) 12.0 - 15.0 g/dL   HCT 82.9 (L) 56.2 - 13.0 %   MCV 119.4 (H) 80.0 - 100.0 fL   MCH 39.3 (H) 26.0 - 34.0 pg   MCHC 32.9 30.0 - 36.0 g/dL   RDW 86.5 (H) 78.4 - 69.6 %   Platelets 76 (L) 150 - 400 K/uL   nRBC 0.0 0.0 - 0.2 %  Basic metabolic panel     Status: Abnormal   Collection Time: 05/04/23  6:20 AM  Result Value  Ref Range   Sodium 137 135 - 145 mmol/L   Potassium 3.7 3.5 - 5.1 mmol/L   Chloride 107 98 - 111 mmol/L   CO2 22 22 - 32 mmol/L   Glucose, Bld 187 (H) 70 - 99 mg/dL   BUN 8 6 - 20 mg/dL   Creatinine, Ser 2.95 0.44 - 1.00 mg/dL   Calcium 8.2 (L) 8.9 - 10.3 mg/dL   GFR, Estimated >28 >41 mL/min   Anion gap 8 5 - 15  HIV Antibody (routine testing w rflx)     Status: None   Collection Time: 05/04/23  6:20 AM  Result Value Ref Range   HIV Screen 4th Generation wRfx Non Reactive Non Reactive  CBG monitoring, ED     Status: Abnormal   Collection Time: 05/04/23  7:36 AM  Result Value Ref Range   Glucose-Capillary 143 (H) 70 - 99 mg/dL  Lactic acid, plasma     Status: None   Collection Time: 05/04/23  9:30 AM  Result Value Ref Range   Lactic Acid, Venous 1.0 0.5 - 1.9 mmol/L  Glucose, capillary     Status: Abnormal   Collection Time: 05/04/23 12:20 PM  Result Value Ref Range   Glucose-Capillary 175 (H) 70 - 99 mg/dL    I have reviewed pertinent nursing notes, vitals, labs, and images as necessary. I have ordered labwork to follow up on as indicated.  I have reviewed the last notes from staff over past 24 hours. I have discussed patient's care plan and test results with nursing staff, CM/SW, and other staff as appropriate.    LOS: 0 days   Lewie Chamber, MD [facility] 05/04/2023, 1:28 PM

## 2023-05-04 NOTE — Assessment & Plan Note (Addendum)
-   on admission, ANC = 1500, with fever - Pancytopenia is actually improved compared to earlier this month (6/1) labs -Treated with cefepime during hospitalization and discharged with Augmentin to complete 7-day course -Blood cultures remaining negative at discharge -Patient also remaining afebrile prior to discharge - TPMT level 27.2 on 08/18/22; repeat level ordered no admission as well as [id number]  -Patient will follow-up outpatient with hematology for ongoing ANC monitoring and resuming Imuran when appropriate

## 2023-05-04 NOTE — Assessment & Plan Note (Addendum)
-   Resume home regimen - A1c 4.9% on 05/04/23

## 2023-05-04 NOTE — Plan of Care (Signed)

## 2023-05-04 NOTE — Consult Note (Signed)
[facility] CONSULT NOTE  Patient Care Team: [name] as PCP - General (Physician Assistant)  ASSESSMENT & PLAN:  Acquired pancytopenia Due to bone marrow suppression from Imuran Agree with placing her medication on hold No transfusion is needed  Neutropenic fever She has been afebrile since admission Urine cultures are neg Blood cultures are pending No need for GCSF now Continue antibiotics  Insulin dependent diabetes Continue insulin  Autoimmune hepatitis Stable despite off medications She can benefit from dose reduction in the future I advise the patient to stay abstinent from future alcohol intake  Discharge planning Hopefully tomorrow if all cultures remain neg I will follow   The total time spent in the appointment was 80 minutes encounter with patients including review of chart and various tests results, discussions about plan of care and coordination of care plan   All questions were answered. The patient knows to call the clinic with any problems, questions or concerns. No barriers to learning was detected.  Artis Delay, MD 6/21/20244:10 PM  CHIEF COMPLAINTS/PURPOSE OF CONSULTATION:  Severe pancytopenia, neutropenic fever  HISTORY OF PRESENTING ILLNESS:  Linda [name] 37 y.o. female is admitted overnight for neutropenic fever She has background history of autoimmune hepatitis and liver cirrhosis She has been on high dose Imuran for 2 years Her baseline labs from 2017 and 2022 was normal She has very frequent blood count monitoring through the Gi clinic, results are reviewed from Care Everywhere. She has chronic pancytopenia for a very long time and has not need transfusions. She never have infections from pancytopenia She bruises easily The patient denies any recent signs or symptoms of bleeding such as spontaneous epistaxis, hematuria or hematochezia. She has no menses for last 7 years due to IUD She is not symptomatic from anemia  such as dyspnea, chest pain or presyncopal episodes She drinks rarely Last month, due to worsening pancytopenia, she was directed to ER, not admission Imuran is on hold  MEDICAL HISTORY:  Past Medical History:  Diagnosis Date   Diabetes mellitus without complication (HCC)    Hepatitis     SURGICAL HISTORY: History reviewed. No pertinent surgical history.  SOCIAL HISTORY: Social History   Socioeconomic History   Marital status: Single    Spouse name: Not on file   Number of children: Not on file   Years of education: Not on file   Highest education level: Not on file  Occupational History   Not on file  Tobacco Use   Smoking status: Never   Smokeless tobacco: Never  Vaping Use   Vaping Use: Never used  Substance and Sexual Activity   Alcohol use: Yes    Alcohol/week: 0.0 standard drinks of alcohol   Drug use: No   Sexual activity: Not on file  Other Topics Concern   Not on file  Social History Narrative   Not on file   Social Determinants of Health   Financial Resource Strain: Not on file  Food Insecurity: No Food Insecurity (05/04/2023)   Hunger Vital Sign    Worried About Running Out of Food in the Last Year: Never true    Ran Out of Food in the Last Year: Never true  Transportation Needs: No Transportation Needs (05/04/2023)   PRAPARE - Administrator, Civil Service (Medical): No    Lack of Transportation (Non-Medical): No  Physical Activity: Not on file  Stress: Not on file  Social Connections: Not on file  Intimate Partner Violence:  Not At Risk (05/04/2023)   Humiliation, Afraid, Rape, and Kick questionnaire    Fear of Current or Ex-Partner: No    Emotionally Abused: No    Physically Abused: No    Sexually Abused: No    FAMILY HISTORY: Family History  Problem Relation Age of Onset   Diabetes Sister     ALLERGIES:  is allergic to other.  MEDICATIONS:  Current Facility-Administered Medications  Medication Dose Route Frequency Provider  Last Rate Last Admin   acetaminophen (TYLENOL) tablet 650 mg  650 mg Oral Q6H PRN Hillary Bow, DO       Or   acetaminophen (TYLENOL) suppository 650 mg  650 mg Rectal Q6H PRN Hillary Bow, DO       ceFEPIme (MAXIPIME) 2 g in sodium chloride 0.9 % 100 mL IVPB  2 g Intravenous Q8H Lyda Perone M, DO   Stopped at 05/04/23 1002   insulin aspart (novoLOG) injection 0-9 Units  0-9 Units Subcutaneous TID WC Lyda Perone M, DO       insulin aspart (novoLOG) injection 3 Units  3 Units Subcutaneous TID WC Hillary Bow, DO       insulin glargine-yfgn [facility]) injection 16 Units  16 Units Subcutaneous QHS Melene Plan, DO       lactated ringers infusion   Intravenous Continuous Sponseller, Rebekah R, PA-C 150 mL/hr at 05/04/23 1507 Infusion Verify at 05/04/23 1507   ondansetron (ZOFRAN) tablet 4 mg  4 mg Oral Q6H PRN Hillary Bow, DO       Or   ondansetron [facility]) injection 4 mg  4 mg Intravenous Q6H PRN Hillary Bow, DO        REVIEW OF SYSTEMS:   Constitutional: Denies fevers, chills or abnormal night sweats Eyes: Denies blurriness of vision, double vision or watery eyes Ears, nose, mouth, throat, and face: Denies mucositis or sore throat Respiratory: Denies cough, dyspnea or wheezes Cardiovascular: Denies palpitation, chest discomfort or lower extremity swelling Gastrointestinal:  Denies nausea, heartburn or change in bowel habits Skin: Denies abnormal skin rashes Lymphatics: Denies new lymphadenopathy  Neurological:Denies numbness, tingling or new weaknesses Behavioral/Psych: Mood is stable, no new changes  All other systems were reviewed with the patient and are negative.  PHYSICAL EXAMINATION: ECOG PERFORMANCE STATUS: 1 - Symptomatic but completely ambulatory  Vitals:   05/04/23 0830 05/04/23 1217  BP: 135/81 127/69  Pulse: 93 100  Resp: 16 18  Temp: 99.2 F (37.3 C) 99 F (37.2 C)  SpO2: 100% 100%   Filed Weights   05/03/23 2137  Weight: 165 lb (74.8 kg)     GENERAL:alert, no distress and comfortable SKIN: noted minor bruises. Spider nevi EYES: normal, conjunctiva are pink and non-injected, sclera clear OROPHARYNX:no exudate, no erythema and lips, buccal mucosa, and tongue normal  NECK: supple, thyroid normal size, non-tender, without nodularity LYMPH:  no palpable lymphadenopathy in the cervical, axillary or inguinal LUNGS: clear to auscultation and percussion with normal breathing effort HEART: regular rate & rhythm and no murmurs and no lower extremity edema ABDOMEN:abdomen soft, non-tender and normal bowel sounds. Palpable splenomegaly Musculoskeletal:no cyanosis of digits and no clubbing  PSYCH: alert & oriented x 3 with fluent speech NEURO: no focal motor/sensory deficits  LABORATORY DATA:  I have reviewed the data as listed Lab Results  Component Value Date   WBC 2.1 (L) 05/04/2023   HGB 8.1 (L) 05/04/2023   HCT 24.5 (L) 05/04/2023   MCV 121.3 (H) 05/04/2023   PLT 78 (  L) 05/04/2023   Recent Labs    04/14/23 0840 05/03/23 2339 05/04/23 0620  NA 136 135 137  K 4.2 4.0 3.7  CL 106 107 107  CO2 22 21* 22  GLUCOSE 161* 275* 187*  BUN 12 11 8   CREATININE 0.34* 0.62 0.46  CALCIUM 8.2* 8.2* 8.2*  GFRNONAA >60 >60 >60  PROT 6.8 6.6  --   ALBUMIN 3.8 3.7  --   AST 36 37  --   ALT 29 31  --   ALKPHOS 72 88  --   BILITOT 2.9* 2.7*  --    I have reviewed her smear. Noted absolute neutropenia and low platelets. Occasional platelet clumping. No schistocytes RADIOGRAPHIC STUDIES: I have personally reviewed the radiological images as listed and agreed with the findings in the report. DG Chest Port 1 View  Result Date: 05/03/2023 CLINICAL DATA:  Fever body ache EXAM: PORTABLE CHEST 1 VIEW COMPARISON:  11/23/2016 FINDINGS: The heart size and mediastinal contours are within normal limits. Both lungs are clear. The visualized skeletal structures are unremarkable. IMPRESSION: No active disease. Electronically Signed   By: Jasmine Pang M.D.   On: 05/03/2023 22:29

## 2023-05-04 NOTE — ED Notes (Signed)
ED TO INPATIENT HANDOFF REPORT  Name/Age/Gender Linda [name] 37 y.o. female  Code Status    Code Status Orders  (From admission, onward)           Start     Ordered   05/04/23 0148  Full code  Continuous       Question:  By:  Answer:  Consent: discussion documented in EHR   05/04/23 0149           Code Status History     This patient has a current code status but no historical code status.       Home/SNF/Other Home  Chief Complaint Febrile neutropenia (HCC) [D70.9, R50.81]  Level of Care/Admitting Diagnosis ED Disposition     ED Disposition  Admit   Condition  --   Comment  Hospital Area: [facility] [[id number]]  Level of Care: Med-Surg [16]  May admit patient to [facility] or [facility] if equivalent level of care is available:: No  Covid Evaluation: Asymptomatic - no recent exposure (last 10 days) testing not required  Diagnosis: Febrile neutropenia [facility]) [[id number]]  Admitting Physician: Hillary Bow [id number]  Attending Physician: Hillary Bow [id number]  Certification:: I certify this patient will need inpatient services for at least 2 midnights  Estimated Length of Stay: 4          Medical History Past Medical History:  Diagnosis Date   Diabetes mellitus without complication (HCC)    Hepatitis     Allergies Allergies  Allergen Reactions   Other Shortness Of Breath    Tree nuts    IV Location/Drains/Wounds Patient Lines/Drains/Airways Status     Active Line/Drains/Airways     Name Placement date Placement time Site Days   Peripheral IV 05/03/23 20 G 1" Anterior;Distal;Right;Upper Arm 05/03/23  2335  Arm  1            Labs/Imaging Results for orders placed or performed during the hospital encounter of 05/03/23 (from the past 48 hour(s))  Lactic acid, plasma     Status: None   Collection Time: 05/03/23 11:39 PM  Result Value Ref Range   Lactic Acid, Venous 1.4 0.5 - 1.9 mmol/L    Comment: Performed at  [facility], [address], [place], [place] [address]  Comprehensive metabolic panel     Status: Abnormal   Collection Time: 05/03/23 11:39 PM  Result Value Ref Range   Sodium 135 135 - 145 mmol/L   Potassium 4.0 3.5 - 5.1 mmol/L   Chloride 107 98 - 111 mmol/L   CO2 21 (L) 22 - 32 mmol/L   Glucose, Bld 275 (H) 70 - 99 mg/dL    Comment: Glucose reference range applies only to samples taken after fasting for at least 8 hours.   BUN 11 6 - 20 mg/dL   Creatinine, Ser 8.65 0.44 - 1.00 mg/dL   Calcium 8.2 (L) 8.9 - 10.3 mg/dL   Total Protein 6.6 6.5 - 8.1 g/dL   Albumin 3.7 3.5 - 5.0 g/dL   AST 37 15 - 41 U/L   ALT 31 0 - 44 U/L   Alkaline Phosphatase 88 38 - 126 U/L   Total Bilirubin 2.7 (H) 0.3 - 1.2 mg/dL   GFR, Estimated >78 >46 mL/min    Comment: (NOTE) Calculated using the CKD-EPI Creatinine Equation (2021)    Anion gap 7 5 - 15    Comment: Performed at [facility], [address]  [place], [place] [address]  CBC with Differential     Status: Abnormal   Collection Time: 05/03/23 11:39 PM  Result Value Ref Range   WBC 2.0 (L) 4.0 - 10.5 K/uL   RBC 2.03 (L) 3.87 - 5.11 MIL/uL   Hemoglobin 8.1 (L) 12.0 - 15.0 g/dL   HCT 60.4 (L) 54.0 - 98.1 %   MCV 118.2 (H) 80.0 - 100.0 fL   MCH 39.9 (H) 26.0 - 34.0 pg   MCHC 33.8 30.0 - 36.0 g/dL   RDW 19.1 (H) 47.8 - 29.5 %   Platelets 85 (L) 150 - 400 K/uL    Comment: Immature Platelet Fraction may be clinically indicated, consider ordering this additional test [id number]    nRBC 0.0 0.0 - 0.2 %   Neutrophils Relative % 74 %   Neutro Abs 1.5 (L) 1.7 - 7.7 K/uL   Lymphocytes Relative 11 %   Lymphs Abs 0.2 (L) 0.7 - 4.0 K/uL   Monocytes Relative 10 %   Monocytes Absolute 0.2 0.1 - 1.0 K/uL   Eosinophils Relative 3 %   Eosinophils Absolute 0.1 0.0 - 0.5 K/uL   Basophils Relative 1 %   Basophils Absolute 0.0 0.0 - 0.1 K/uL   Immature Granulocytes 1 %   Abs Immature Granulocytes 0.02 0.00 -  0.07 K/uL    Comment: Performed at [facility], [address] [place], [place] [address]  Protime-INR     Status: Abnormal   Collection Time: 05/03/23 11:39 PM  Result Value Ref Range   Prothrombin Time 18.2 (H) 11.4 - 15.2 seconds   INR 1.5 (H) 0.8 - 1.2    Comment: (NOTE) INR goal varies based on device and disease states. Performed at [facility], [address], [place], [place] [address]   APTT     Status: None   Collection Time: 05/03/23 11:39 PM  Result Value Ref Range   aPTT 35 24 - 36 seconds    Comment: Performed at [facility], [address], [place], [place] [address]  hCG, quantitative, pregnancy     Status: None   Collection Time: 05/03/23 11:39 PM  Result Value Ref Range   hCG, Beta Chain, Quant, S <1 <5 mIU/mL    Comment:          GEST. AGE      CONC.  (mIU/mL)   <=1 WEEK        5 - 50     2 WEEKS       50 - 500     3 WEEKS       100 - 10,000     4 WEEKS     1,000 - 30,000     5 WEEKS     3,500 - 115,000   6-8 WEEKS     12,000 - 270,000    12 WEEKS     15,000 - 220,000        FEMALE AND NON-PREGNANT FEMALE:     LESS THAN 5 mIU/mL Performed at [facility], [address], [place], [place] [address]   Urinalysis, w/ Reflex to Culture (Infection Suspected) -Urine, Clean Catch     Status: Abnormal   Collection Time: 05/04/23 12:15 AM  Result Value Ref Range   Specimen Source URINE, CLEAN CATCH    Color, Urine YELLOW YELLOW   APPearance CLEAR CLEAR   Specific Gravity, Urine 1.008 1.005 - 1.030   pH 5.0 5.0 - 8.0  Glucose, UA >=500 (A) NEGATIVE mg/dL   Hgb urine dipstick NEGATIVE NEGATIVE   Bilirubin Urine NEGATIVE NEGATIVE   Ketones, ur NEGATIVE NEGATIVE mg/dL   Protein, ur NEGATIVE NEGATIVE mg/dL   Nitrite NEGATIVE NEGATIVE   Leukocytes,Ua NEGATIVE NEGATIVE   RBC / HPF 0-5 0 - 5 RBC/hpf   WBC, UA 0-5 0 - 5 WBC/hpf    Comment:        Reflex urine culture not performed if  WBC <=10, OR if Squamous epithelial cells >5. If Squamous epithelial cells >5 suggest recollection.    Bacteria, UA NONE SEEN NONE SEEN   Squamous Epithelial / HPF 0-5 0 - 5 /HPF   Mucus PRESENT     Comment: Performed at [facility], [address] [place], [place] [address]  SARS Coronavirus 2 by RT PCR (hospital order, performed in [facility] hospital lab) *cepheid single result test*     Status: None   Collection Time: 05/04/23  3:45 AM   Specimen: Nasal Swab  Result Value Ref Range   SARS Coronavirus 2 by RT PCR NEGATIVE NEGATIVE    Comment: (NOTE) SARS-CoV-2 target nucleic acids are NOT DETECTED.  The SARS-CoV-2 RNA is generally detectable in upper and lower respiratory specimens during the acute phase of infection. The lowest concentration of SARS-CoV-2 viral copies this assay can detect is 250 copies / mL. A negative result does not preclude SARS-CoV-2 infection and should not be used as the sole basis for treatment or other patient management decisions.  A negative result may occur with improper specimen collection / handling, submission of specimen other than nasopharyngeal swab, presence of viral mutation(s) within the areas targeted by this assay, and inadequate number of viral copies (<250 copies / mL). A negative result must be combined with clinical observations, patient history, and epidemiological information.  Fact Sheet for Patients:   RoadLapTop.co.za  Fact Sheet for Healthcare Providers: http://kim-miller.com/  This test is not yet approved or  cleared by the [place] FDA and has been authorized for detection and/or diagnosis of SARS-CoV-2 by FDA under an Emergency Use Authorization (EUA).  This EUA will remain in effect (meaning this test can be used) for the duration of the COVID-19 declaration under Section 564(b)(1) of the Act, 21 U.S.C. section 360bbb-3(b)(1), unless the  authorization is terminated or revoked sooner.  Performed at [facility], [name], [place], [place] [address]   Basic metabolic panel     Status: Abnormal   Collection Time: 05/04/23  6:20 AM  Result Value Ref Range   Sodium 137 135 - 145 mmol/L   Potassium 3.7 3.5 - 5.1 mmol/L   Chloride 107 98 - 111 mmol/L   CO2 22 22 - 32 mmol/L   Glucose, Bld 187 (H) 70 - 99 mg/dL    Comment: Glucose reference range applies only to samples taken after fasting for at least 8 hours.   BUN 8 6 - 20 mg/dL   Creatinine, Ser 0.98 0.44 - 1.00 mg/dL   Calcium 8.2 (L) 8.9 - 10.3 mg/dL   GFR, Estimated >11 >91 mL/min    Comment: (NOTE) Calculated using the CKD-EPI Creatinine Equation (2021)    Anion gap 8 5 - 15    Comment: Performed at [facility], [address] [place], [place] [address]   DG Chest Port 1 View  Result Date: 05/03/2023 CLINICAL DATA:  Fever body ache EXAM: PORTABLE CHEST 1 VIEW COMPARISON:  11/23/2016 FINDINGS: The heart  size and mediastinal contours are within normal limits. Both lungs are clear. The visualized skeletal structures are unremarkable. IMPRESSION: No active disease. Electronically Signed   By: Jasmine Pang M.D.   On: 05/03/2023 22:29    Pending Labs Unresulted Labs (From admission, onward)     Start     Ordered   05/04/23 0500  CBC  Tomorrow morning,   R        05/04/23 0149   05/04/23 0500  HIV Antibody (routine testing w rflx)  (HIV Antibody (Routine testing w reflex) panel)  Once,   R        05/04/23 0259   05/04/23 0206  Miscellaneous [facility] test (send-out)  Once,   R       Question:  Test name / description:  Answer:  [id number]   05/04/23 0208   05/04/23 0136  Hemoglobin A1c  Once,   URGENT       Comments: To assess prior glycemic control    05/04/23 0135   05/04/23 0130  Lactic acid, plasma  Once,   R        05/04/23 0130   05/03/23 2212  Resp panel by RT-PCR (RSV, Flu A&B, Covid) Urine, Clean Catch  (Septic  presentation on arrival (screening labs, nursing and treatment orders for obvious sepsis))  Once,   URGENT        05/03/23 2212   05/03/23 2212  Blood Culture (routine x 2)  (Septic presentation on arrival (screening labs, nursing and treatment orders for obvious sepsis))  BLOOD CULTURE X 2,   STAT      05/03/23 2212            Vitals/Pain Today's Vitals   05/04/23 0429 05/04/23 0429 05/04/23 0430 05/04/23 0618  BP:  118/64  110/64  Pulse:  90  86  Resp:  15  18  Temp:  99.1 F (37.3 C)  99.2 F (37.3 C)  TempSrc:    Oral  SpO2:  99%  98%  Weight:      Height:      PainSc: 4  4  4   0-No pain    Isolation Precautions Airborne and Contact precautions  Medications Medications  lactated ringers infusion ( Intravenous New Bag/Given 05/04/23 0006)  insulin aspart (novoLOG) injection 0-9 Units (has no administration in time range)  insulin aspart (novoLOG) injection 3 Units (has no administration in time range)  insulin glargine-yfgn (SEMGLEE) injection 16 Units (16 Units Subcutaneous Patient Refused/Not Given 05/04/23 0313)  ceFEPIme (MAXIPIME) 2 g in sodium chloride 0.9 % 100 mL IVPB (has no administration in time range)  acetaminophen (TYLENOL) tablet 650 mg (has no administration in time range)    Or  acetaminophen (TYLENOL) suppository 650 mg (has no administration in time range)  ondansetron (ZOFRAN) tablet 4 mg (has no administration in time range)    Or  ondansetron (ZOFRAN) injection 4 mg (has no administration in time range)  lactated ringers bolus 1,000 mL (0 mLs Intravenous Stopped 05/04/23 0136)  ceFEPIme (MAXIPIME) 2 g in sodium chloride 0.9 % 100 mL IVPB (0 g Intravenous Stopped 05/04/23 0035)  acetaminophen (TYLENOL) tablet 650 mg (650 mg Oral Given 05/04/23 0312)    Mobility walks

## 2023-05-04 NOTE — Hospital Course (Signed)
Ms. Noto is a 37 yo female with PMH autoimmune hepatitis, IDDM who has been managed on Imuran.  She is followed by GI with [facility] (Dr. Joellyn Rued).  She presented to the hospital with development of fevers and bodyaches. Imuran was recently stopped on 04/14/2023 after she developed severe neutropenia.  On workup, she was found to have improved but persistent neutropenia.  ANC 1500 on admission. Urinalysis negative for signs of infection.  CXR clear.  No obvious etiology for infectious source. Blood cultures were obtained and she was started on cefepime and admitted for further monitoring. Heme/onc was also consulted on admission as well.  Blood cultures remained negative with monitoring during hospitalization.  ANC remained decreased however given dose of Imuran, it was suspected that bone marrow suppression will take several weeks before rebounding.  She will continue to follow outpatient with hematology at discharge and will undergo further Imuran resumption and adjustment as necessary.

## 2023-05-05 ENCOUNTER — Other Ambulatory Visit: Payer: Self-pay | Admitting: Hematology and Oncology

## 2023-05-05 DIAGNOSIS — K754 Autoimmune hepatitis: Secondary | ICD-10-CM | POA: Diagnosis not present

## 2023-05-05 DIAGNOSIS — D709 Neutropenia, unspecified: Secondary | ICD-10-CM | POA: Diagnosis not present

## 2023-05-05 DIAGNOSIS — D61818 Other pancytopenia: Secondary | ICD-10-CM | POA: Diagnosis not present

## 2023-05-05 DIAGNOSIS — R5081 Fever presenting with conditions classified elsewhere: Secondary | ICD-10-CM | POA: Diagnosis not present

## 2023-05-05 LAB — CBC WITH DIFFERENTIAL/PLATELET
Abs Immature Granulocytes: 0 10*3/uL (ref 0.00–0.07)
Basophils Absolute: 0 10*3/uL (ref 0.0–0.1)
Basophils Relative: 0 %
Eosinophils Absolute: 0.1 10*3/uL (ref 0.0–0.5)
Eosinophils Relative: 9 %
HCT: 23.9 % — ABNORMAL LOW (ref 36.0–46.0)
Hemoglobin: 7.8 g/dL — ABNORMAL LOW (ref 12.0–15.0)
Lymphocytes Relative: 12 %
Lymphs Abs: 0.2 10*3/uL — ABNORMAL LOW (ref 0.7–4.0)
MCH: 39.8 pg — ABNORMAL HIGH (ref 26.0–34.0)
MCHC: 32.6 g/dL (ref 30.0–36.0)
MCV: 121.9 fL — ABNORMAL HIGH (ref 80.0–100.0)
Monocytes Absolute: 0.1 10*3/uL (ref 0.1–1.0)
Monocytes Relative: 8 %
Neutro Abs: 1.1 10*3/uL — ABNORMAL LOW (ref 1.7–7.7)
Neutrophils Relative %: 71 %
Platelets: 69 10*3/uL — ABNORMAL LOW (ref 150–400)
RBC: 1.96 MIL/uL — ABNORMAL LOW (ref 3.87–5.11)
RDW: 17.1 % — ABNORMAL HIGH (ref 11.5–15.5)
WBC: 1.5 10*3/uL — ABNORMAL LOW (ref 4.0–10.5)
nRBC: 0 % (ref 0.0–0.2)

## 2023-05-05 LAB — MAGNESIUM: Magnesium: 1.8 mg/dL (ref 1.7–2.4)

## 2023-05-05 LAB — GLUCOSE, CAPILLARY
Glucose-Capillary: 95 mg/dL (ref 70–99)
Glucose-Capillary: 142 mg/dL — ABNORMAL HIGH (ref 70–99)

## 2023-05-05 LAB — BASIC METABOLIC PANEL
Anion gap: 6 (ref 5–15)
BUN: 9 mg/dL (ref 6–20)
CO2: 20 mmol/L — ABNORMAL LOW (ref 22–32)
Calcium: 8.2 mg/dL — ABNORMAL LOW (ref 8.9–10.3)
Chloride: 110 mmol/L (ref 98–111)
Creatinine, Ser: 0.48 mg/dL (ref 0.44–1.00)
GFR, Estimated: >60 mL/min (ref >60)
Glucose, Bld: 110 mg/dL — ABNORMAL HIGH (ref 70–99)
Potassium: 3.8 mmol/L (ref 3.5–5.1)
Sodium: 136 mmol/L (ref 135–145)

## 2023-05-05 LAB — CULTURE, BLOOD (ROUTINE X 2)

## 2023-05-05 MED ORDER — AMOXICILLIN-POT CLAVULANATE 875-125 MG PO TABS
1.0000 | ORAL_TABLET | Freq: Two times a day (BID) | ORAL | Status: DC
  Administered 2023-05-05: 1 via ORAL
  Filled 2023-05-05: qty 1

## 2023-05-05 MED ORDER — AMOXICILLIN-POT CLAVULANATE 875-125 MG PO TABS: 1.0000 | ORAL_TABLET | Freq: Two times a day (BID) | ORAL | 0 refills | Status: AC

## 2023-05-05 NOTE — Plan of Care (Signed)

## 2023-05-05 NOTE — Plan of Care (Signed)
  Problem: Education: Goal: Ability to describe self-care measures that may prevent or decrease complications (Diabetes Survival Skills Education) will improve Outcome: Progressing   Problem: Coping: Goal: Ability to adjust to condition or change in health will improve Outcome: Progressing   Problem: Fluid Volume: Goal: Ability to maintain a balanced intake and output will improve Outcome: Progressing   Problem: Skin Integrity: Goal: Risk for impaired skin integrity will decrease Outcome: Progressing   Problem: Tissue Perfusion: Goal: Adequacy of tissue perfusion will improve Outcome: Progressing   Problem: Pain Managment: Goal: General experience of comfort will improve Outcome: Progressing   Problem: Safety: Goal: Ability to remain free from injury will improve Outcome: Progressing

## 2023-05-05 NOTE — Plan of Care (Signed)
  Problem: Education: Goal: Ability to describe self-care measures that may prevent or decrease complications (Diabetes Survival Skills Education) will improve Outcome: Progressing   Problem: Coping: Goal: Ability to adjust to condition or change in health will improve Outcome: Progressing   Problem: Fluid Volume: Goal: Ability to maintain a balanced intake and output will improve Outcome: Progressing   Problem: Skin Integrity: Goal: Risk for impaired skin integrity will decrease Outcome: Progressing   Problem: Tissue Perfusion: Goal: Adequacy of tissue perfusion will improve Outcome: Progressing   Problem: Pain Managment: Goal: General experience of comfort will improve Outcome: Progressing   Problem: Safety: Goal: Ability to remain free from injury will improve Outcome: Progressing   

## 2023-05-05 NOTE — Progress Notes (Signed)
SUZANNE KHO   DOB:1986/09/22   ZO#:[id number]    ASSESSMENT & PLAN:   Acquired pancytopenia Due to bone marrow suppression from Imuran Agree with placing her medication on hold No transfusion is needed   Neutropenic fever She has been afebrile since admission Urine cultures are neg Blood cultures are pending No need for GCSF now Continue antibiotics, transition to oral after discharge   Insulin dependent diabetes Continue insulin   Autoimmune hepatitis Stable despite off medications She can benefit from dose reduction in the future I advise the patient to stay abstinent from future alcohol intake   Discharge planning She can be Dc this evening if remain afebrile I will set up follow-up on 6/28, appointment give to patient Will sign off  All questions were answered. The patient knows to call the clinic with any problems, questions or concerns.   The total time spent in the appointment was 40 minutes encounter with patients including review of chart and various tests results, discussions about plan of care and coordination of care plan  Artis Delay, MD 05/05/2023 10:52 AM  Subjective:  She is doing well.  She remained afebrile.  No recent bleeding.  Reviewed plan of care with her stepmother over the phone  Objective:  Vitals:   05/04/23 2001 05/05/23 0419  BP: 131/83 135/83  Pulse: 99 80  Resp: 17 16  Temp: 98.6 F (37 C) 98.3 F (36.8 C)  SpO2: 99% 100%     Intake/Output Summary (Last 24 hours) at 05/05/2023 1052 Last data filed at 05/05/2023 9811 Gross per 24 hour  Intake 2804.34 ml  Output --  Net 2804.34 ml    GENERAL:alert, no distress and comfortable NEURO: alert & oriented x 3 with fluent speech, no focal motor/sensory deficits   Labs:  Recent Labs    04/14/23 0840 05/03/23 2339 05/04/23 0620 05/05/23 0403  NA 136 135 137 136  K 4.2 4.0 3.7 3.8  CL 106 107 107 110  CO2 22 21* 22 20*  GLUCOSE 161* 275* 187* 110*  BUN 12 11 8 9   CREATININE 0.34*  0.62 0.46 0.48  CALCIUM 8.2* 8.2* 8.2* 8.2*  GFRNONAA >60 >60 >60 >60  PROT 6.8 6.6  --   --   ALBUMIN 3.8 3.7  --   --   AST 36 37  --   --   ALT 29 31  --   --   ALKPHOS 72 88  --   --   BILITOT 2.9* 2.7*  --   --     Studies:  DG Chest Port 1 View  Result Date: 05/03/2023 CLINICAL DATA:  Fever body ache EXAM: PORTABLE CHEST 1 VIEW COMPARISON:  11/23/2016 FINDINGS: The heart size and mediastinal contours are within normal limits. Both lungs are clear. The visualized skeletal structures are unremarkable. IMPRESSION: No active disease. Electronically Signed   By: Jasmine Pang M.D.   On: 05/03/2023 22:29

## 2023-05-05 NOTE — Discharge Summary (Signed)
Physician Discharge Summary   Linda [name]:[id number] DOB: January 03, 1986 DOA: 05/03/2023  PCP: [name], PA-C  Admit date: 05/03/2023 Discharge date: 05/05/2023  Admitted From: Home Disposition:  Home Discharging physician: Lewie Chamber, MD Barriers to discharge: none  Recommendations at discharge: Follow up with hematology Continue serial ANC monitoring and resume lower dose Imuran per hematology when appropriate   Discharge Condition: stable CODE STATUS: Full Diet recommendation:  Diet Orders (From admission, onward)     Start     Ordered   05/05/23 0000  Diet Carb Modified        05/05/23 1522   05/04/23 0136  Diet Carb Modified Fluid consistency: Thin; Room service appropriate? Yes  Diet effective now       Question Answer Comment  Diet-HS Snack? Nothing   Calorie Level Medium 1600-2000   Fluid consistency: Thin   Room service appropriate? Yes      05/04/23 0135            Hospital Course: Ms. 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  She is followed by GI with [facility] (Dr. Joellyn Rued).  She presented to the hospital with development of fevers and bodyaches. Imuran was recently stopped on 04/14/2023 after she developed severe neutropenia.  On workup, she was found to have improved but persistent neutropenia.  ANC 1500 on admission. Urinalysis negative for signs of infection.  CXR clear.  No obvious etiology for infectious source. Blood cultures were obtained and she was started on cefepime and admitted for further monitoring. Heme/onc was also consulted on admission as well.  Blood cultures remained negative with monitoring during hospitalization.  ANC remained decreased however given dose of Imuran, it was suspected that bone marrow suppression will take several weeks before rebounding.  She will continue to follow outpatient with hematology at discharge and will undergo further Imuran resumption and  adjustment as necessary.  Assessment and Plan: * Febrile neutropenia (HCC) - on admission, ANC = 1500, with fever - Pancytopenia is actually improved compared to earlier this month (6/1) labs -Treated with cefepime during hospitalization and discharged with Augmentin to complete 7-day course -Blood cultures remaining negative at discharge -Patient also remaining afebrile prior to discharge - TPMT level 27.2 on 08/18/22; repeat level ordered no admission as well as [id number]  -Patient will follow-up outpatient with hematology for ongoing ANC monitoring and resuming Imuran when appropriate  Pancytopenia (HCC) - has remained neutropenic and pancytopenic since ~1 year - WBC has appeared to be around 1.5 - 2 but since 5/31 has dropped further - imuran discontinued on 6/1 and WBC has technically returned to prior values ~2 - possible viral cause for recent drop but it looks to be recovering back to at least prior values - may still take time for improvement as bone marrow may be overly suppressed; hematology will continue to follow outpatient -Iron stores and folate/B12 are appropriate  Autoimmune hepatitis (HCC) - Previously on imuran, imuran now on hold due to severe pancytopenia -Outpatient follow-up with hematology for dosage reduction and when to resume  DM (diabetes mellitus) (HCC) - Resume home regimen - A1c 4.9% on 05/04/23   The patient's chronic medical conditions were treated accordingly per the patient's home medication regimen except as noted.  On day of discharge, patient was felt deemed stable for discharge. Patient/family member advised to call PCP or come back to ER if needed.   Principal Diagnosis: Febrile neutropenia (HCC)  Discharge Diagnoses: Active Hospital Problems   Diagnosis Date Noted   Febrile neutropenia (HCC) 05/04/2023    Priority: 1.   Pancytopenia (HCC) 05/04/2023    Priority: 2.   Autoimmune hepatitis (HCC) 05/04/2023    Priority: 3.   DM (diabetes  mellitus) (HCC) 05/04/2023    Resolved Hospital Problems  No resolved problems to display.     Discharge Instructions     Diet Carb Modified   Complete by: As directed    Increase activity slowly   Complete by: As directed       Allergies as of 05/05/2023       Reactions   Other Shortness Of Breath   Tree nuts        Medication List     TAKE these medications    amoxicillin-clavulanate 875-125 MG tablet Commonly known as: AUGMENTIN Take 1 tablet by mouth 2 (two) times daily for 7 days.   ibuprofen 200 MG tablet Commonly known as: ADVIL Take 200 mg by mouth every 6 (six) hours as needed for fever.   Lyumjev KwikPen 100 UNIT/ML KwikPen Generic drug: Insulin Lispro-aabc INJECT 1 UNIT UNDER THE SKIN PER 15 GRAMS CARBOHYDRATES PLUS SLIDING SCALE. ADD 1 UNIT PER GLUCOSE 50 ABOVE 150. TOTAL 30 UNITS/DAY.   Mirena (52 MG) 20 MCG/DAY Iud Generic drug: levonorgestrel Mirena 20 mcg/24 hours (7 yrs) 52 mg intrauterine device  Take by intrauterine route.   Toujeo SoloStar 300 UNIT/ML Solostar Pen Generic drug: insulin glargine (1 Unit Dial) Inject 16 Units into the skin at bedtime.        Follow-up Information     Artis Delay, MD. Schedule an appointment as soon as possible for a visit in 2 week(s).   Specialty: Hematology and Oncology Contact information: [address] [place] [address] [phone]                Allergies  Allergen Reactions   Other Shortness Of Breath    Tree nuts    Consultations: Hematology  Procedures:   Discharge Exam: BP 124/78 (BP Location: Left Arm)   Pulse 89   Temp 98.7 F (37.1 C) (Oral)   Resp 16   Ht 5\' 10"  (1.778 m)   Wt 74.8 kg   SpO2 97%   BMI 23.68 kg/m  Physical Exam Constitutional:      General: She is not in acute distress.    Appearance: Normal appearance. She is not ill-appearing.  HENT:     Head: Normocephalic and atraumatic.     Mouth/Throat:     Mouth: Mucous  membranes are moist.  Eyes:     Extraocular Movements: Extraocular movements intact.  Cardiovascular:     Rate and Rhythm: Normal rate and regular rhythm.  Pulmonary:     Effort: Pulmonary effort is normal. No respiratory distress.     Breath sounds: Normal breath sounds. No wheezing.  Abdominal:     General: Bowel sounds are normal. There is no distension.     Palpations: Abdomen is soft.     Tenderness: There is no abdominal tenderness.  Musculoskeletal:        General: No swelling. Normal range of motion.     Cervical back: Normal range of motion and neck supple.  Skin:    General: Skin is warm and dry.  Neurological:     General: No focal deficit present.     Mental Status: She is alert.  Psychiatric:        Mood and  Affect: Mood normal.        Behavior: Behavior normal.      The results of significant diagnostics from this hospitalization (including imaging, microbiology, ancillary and laboratory) are listed below for reference.   Microbiology: Recent Results (from the past 240 hour(s))  Blood Culture (routine x 2)     Status: None (Preliminary result)   Collection Time: 05/03/23  3:45 AM   Specimen: BLOOD  Result Value Ref Range Status   Specimen Description   Final    BLOOD BLOOD RIGHT FOREARM Performed at [facility], [address], [place], [place] [address]    Special Requests   Final    BOTTLES DRAWN AEROBIC AND ANAEROBIC Blood Culture adequate volume Performed at [facility], [address] [place], [place] [address]    Culture   Final    NO GROWTH 1 DAY Performed at [facility], [address], [place], [place] [address]    Report Status PENDING  Incomplete  Blood Culture (routine x 2)     Status: None (Preliminary result)   Collection Time: 05/03/23 11:39 PM   Specimen: BLOOD RIGHT ARM  Result Value Ref Range Status   Specimen Description   Final    BLOOD RIGHT ARM Performed at [facility],  [address], [place], [place] [address]    Special Requests   Final    BOTTLES DRAWN AEROBIC AND ANAEROBIC Blood Culture adequate volume Performed at [facility], [address] [place], [place] [address]    Culture   Final    NO GROWTH 1 DAY Performed at [facility], [address], [place], [place] [address]    Report Status PENDING  Incomplete  SARS Coronavirus 2 by RT PCR (hospital order, performed in [facility] hospital lab) *cepheid single result test*     Status: None   Collection Time: 05/04/23  3:45 AM   Specimen: Nasal Swab  Result Value Ref Range Status   SARS Coronavirus 2 by RT PCR NEGATIVE NEGATIVE Final    Comment: (NOTE) SARS-CoV-2 target nucleic acids are NOT DETECTED.  The SARS-CoV-2 RNA is generally detectable in upper and lower respiratory specimens during the acute phase of infection. The lowest concentration of SARS-CoV-2 viral copies this assay can detect is 250 copies / mL. A negative result does not preclude SARS-CoV-2 infection and should not be used as the sole basis for treatment or other patient management decisions.  A negative result may occur with improper specimen collection / handling, submission of specimen other than nasopharyngeal swab, presence of viral mutation(s) within the areas targeted by this assay, and inadequate number of viral copies (<250 copies / mL). A negative result must be combined with clinical observations, patient history, and epidemiological information.  Fact Sheet for Patients:   RoadLapTop.co.za  Fact Sheet for Healthcare Providers: http://kim-miller.com/  This test is not yet approved or  cleared by the [place] FDA and has been authorized for detection and/or diagnosis of SARS-CoV-2 by FDA under an Emergency Use Authorization (EUA).  This EUA will remain in effect (meaning this test can be used) for the duration of the COVID-19 declaration  under Section 564(b)(1) of the Act, 21 U.S.C. section 360bbb-3(b)(1), unless the authorization is terminated or revoked sooner.  Performed at [facility], [address] [place], [place] [address]      Labs: BNP (last 3 results) No results for input(s): "BNP" in the  last 8760 hours. Basic Metabolic Panel: Recent Labs  Lab 05/03/23 2339 05/04/23 0620 05/05/23 0403  NA 135 137 136  K 4.0 3.7 3.8  CL 107 107 110  CO2 21* 22 20*  GLUCOSE 275* 187* 110*  BUN 11 8 9   CREATININE 0.62 0.46 0.48  CALCIUM 8.2* 8.2* 8.2*  MG  --   --  1.8   Liver Function Tests: Recent Labs  Lab 05/03/23 2339  AST 37  ALT 31  ALKPHOS 88  BILITOT 2.7*  PROT 6.6  ALBUMIN 3.7   No results for input(s): "LIPASE", "AMYLASE" in the last 168 hours. No results for input(s): "AMMONIA" in the last 168 hours. CBC: Recent Labs  Lab 05/03/23 2339 05/04/23 0620 05/04/23 1232 05/05/23 0403  WBC 2.0* 2.1* 2.1* 1.5*  NEUTROABS 1.5*  --  1.5* 1.1*  HGB 8.1* 7.7* 8.1* 7.8*  HCT 24.0* 23.4* 24.5* 23.9*  MCV 118.2* 119.4* 121.3* 121.9*  PLT 85* 76* 78* 69*   Cardiac Enzymes: No results for input(s): "CKTOTAL", "CKMB", "CKMBINDEX", "TROPONINI" in the last 168 hours. BNP: Invalid input(s): "POCBNP" CBG: Recent Labs  Lab 05/04/23 1220 05/04/23 1617 05/04/23 2122 05/05/23 0756 05/05/23 1301  GLUCAP 175* 153* 201* 95 142*   D-Dimer No results for input(s): "DDIMER" in the last 72 hours. Hgb A1c Recent Labs    05/04/23 0620  HGBA1C 4.9   Lipid Profile No results for input(s): "CHOL", "HDL", "LDLCALC", "TRIG", "CHOLHDL", "LDLDIRECT" in the last 72 hours. Thyroid function studies No results for input(s): "TSH", "T4TOTAL", "T3FREE", "THYROIDAB" in the last 72 hours.  Invalid input(s): "FREET3" Anemia work up Recent Labs    05/04/23 1232  VITAMINB12 629  FOLATE 16.0  FERRITIN 28  TIBC 259  IRON 89   Urinalysis    Component Value Date/Time   COLORURINE YELLOW  05/04/2023 0015   APPEARANCEUR CLEAR 05/04/2023 0015   LABSPEC 1.008 05/04/2023 0015   PHURINE 5.0 05/04/2023 0015   GLUCOSEU >=500 (A) 05/04/2023 0015   HGBUR NEGATIVE 05/04/2023 0015   BILIRUBINUR NEGATIVE 05/04/2023 0015   KETONESUR NEGATIVE 05/04/2023 0015   PROTEINUR NEGATIVE 05/04/2023 0015   NITRITE NEGATIVE 05/04/2023 0015   LEUKOCYTESUR NEGATIVE 05/04/2023 0015   Sepsis Labs Recent Labs  Lab 05/03/23 2339 05/04/23 0620 05/04/23 1232 05/05/23 0403  WBC 2.0* 2.1* 2.1* 1.5*   Microbiology Recent Results (from the past 240 hour(s))  Blood Culture (routine x 2)     Status: None (Preliminary result)   Collection Time: 05/03/23  3:45 AM   Specimen: BLOOD  Result Value Ref Range Status   Specimen Description   Final    BLOOD BLOOD RIGHT FOREARM Performed at [facility], [address], [place], [place] [address]    Special Requests   Final    BOTTLES DRAWN AEROBIC AND ANAEROBIC Blood Culture adequate volume Performed at [facility], [address], [place], [place] [address]    Culture   Final    NO GROWTH 1 DAY Performed at [facility], [address] [place], [place] [address]    Report Status PENDING  Incomplete  Blood Culture (routine x 2)     Status: None (Preliminary result)   Collection Time: 05/03/23 11:39 PM   Specimen: BLOOD RIGHT ARM  Result Value Ref Range Status   Specimen Description   Final    BLOOD RIGHT ARM Performed at [facility], [address], [place], [place] [address]    Special Requests  Final    BOTTLES DRAWN AEROBIC AND ANAEROBIC Blood Culture adequate volume Performed at [facility], [address] [place], [place] [address]    Culture   Final    NO GROWTH 1 DAY Performed at [facility], [address], [place], [place] [address]    Report Status PENDING  Incomplete  SARS Coronavirus 2 by RT PCR (hospital order, performed in [facility] hospital  lab) *cepheid single result test*     Status: None   Collection Time: 05/04/23  3:45 AM   Specimen: Nasal Swab  Result Value Ref Range Status   SARS Coronavirus 2 by RT PCR NEGATIVE NEGATIVE Final    Comment: (NOTE) SARS-CoV-2 target nucleic acids are NOT DETECTED.  The SARS-CoV-2 RNA is generally detectable in upper and lower respiratory specimens during the acute phase of infection. The lowest concentration of SARS-CoV-2 viral copies this assay can detect is 250 copies / mL. A negative result does not preclude SARS-CoV-2 infection and should not be used as the sole basis for treatment or other patient management decisions.  A negative result may occur with improper specimen collection / handling, submission of specimen other than nasopharyngeal swab, presence of viral mutation(s) within the areas targeted by this assay, and inadequate number of viral copies (<250 copies / mL). A negative result must be combined with clinical observations, patient history, and epidemiological information.  Fact Sheet for Patients:   RoadLapTop.co.za  Fact Sheet for Healthcare Providers: http://kim-miller.com/  This test is not yet approved or  cleared by the [place] FDA and has been authorized for detection and/or diagnosis of SARS-CoV-2 by FDA under an Emergency Use Authorization (EUA).  This EUA will remain in effect (meaning this test can be used) for the duration of the COVID-19 declaration under Section 564(b)(1) of the Act, 21 U.S.C. section 360bbb-3(b)(1), unless the authorization is terminated or revoked sooner.  Performed at [facility], [address], [place], [place] [address]     Procedures/Studies: DG Chest Port 1 View  Result Date: 05/03/2023 CLINICAL DATA:  Fever body ache EXAM: PORTABLE CHEST 1 VIEW COMPARISON:  11/23/2016 FINDINGS: The heart size and mediastinal contours are within normal limits. Both  lungs are clear. The visualized skeletal structures are unremarkable. IMPRESSION: No active disease. Electronically Signed   By: Jasmine Pang M.D.   On: 05/03/2023 22:29     Time coordinating discharge: Over 30 minutes    Lewie Chamber, MD  [facility] 05/05/2023, 3:27 PM

## 2023-05-06 LAB — CULTURE, BLOOD (ROUTINE X 2)

## 2023-05-07 LAB — CULTURE, BLOOD (ROUTINE X 2)
Culture: NO GROWTH
Special Requests: ADEQUATE

## 2023-05-08 ENCOUNTER — Telehealth: Payer: Self-pay | Admitting: Hematology and Oncology

## 2023-05-08 ENCOUNTER — Other Ambulatory Visit: Payer: Self-pay | Admitting: Hematology and Oncology

## 2023-05-08 DIAGNOSIS — D61818 Other pancytopenia: Secondary | ICD-10-CM

## 2023-05-08 LAB — CULTURE, BLOOD (ROUTINE X 2)

## 2023-05-08 NOTE — Telephone Encounter (Signed)
Left patient a vm regarding upcoming appointment  

## 2023-05-09 LAB — CULTURE, BLOOD (ROUTINE X 2)
Culture: NO GROWTH
Special Requests: ADEQUATE

## 2023-05-11 ENCOUNTER — Encounter: Payer: Self-pay | Admitting: Hematology and Oncology

## 2023-05-11 ENCOUNTER — Inpatient Hospital Stay (HOSPITAL_BASED_OUTPATIENT_CLINIC_OR_DEPARTMENT_OTHER): Payer: 59 | Admitting: Hematology and Oncology

## 2023-05-11 ENCOUNTER — Telehealth: Payer: Self-pay

## 2023-05-11 ENCOUNTER — Inpatient Hospital Stay: Payer: 59 | Attending: Hematology and Oncology

## 2023-05-11 VITALS — BP 114/60 | HR 71 | Temp 98.3°F | Resp 18 | Ht 70.0 in | Wt 171.2 lb

## 2023-05-11 DIAGNOSIS — R5081 Fever presenting with conditions classified elsewhere: Secondary | ICD-10-CM | POA: Insufficient documentation

## 2023-05-11 DIAGNOSIS — K7460 Unspecified cirrhosis of liver: Secondary | ICD-10-CM | POA: Insufficient documentation

## 2023-05-11 DIAGNOSIS — D61818 Other pancytopenia: Secondary | ICD-10-CM

## 2023-05-11 DIAGNOSIS — D702 Other drug-induced agranulocytosis: Secondary | ICD-10-CM | POA: Diagnosis present

## 2023-05-11 DIAGNOSIS — D709 Neutropenia, unspecified: Secondary | ICD-10-CM

## 2023-05-11 DIAGNOSIS — K754 Autoimmune hepatitis: Secondary | ICD-10-CM | POA: Diagnosis not present

## 2023-05-11 LAB — CBC WITH DIFFERENTIAL/PLATELET
Abs Immature Granulocytes: 0 10*3/uL (ref 0.00–0.07)
Basophils Absolute: 0 10*3/uL (ref 0.0–0.1)
Basophils Relative: 2 %
Eosinophils Absolute: 0.1 10*3/uL (ref 0.0–0.5)
Eosinophils Relative: 8 %
HCT: 28.2 % — ABNORMAL LOW (ref 36.0–46.0)
Hemoglobin: 9.4 g/dL — ABNORMAL LOW (ref 12.0–15.0)
Immature Granulocytes: 0 %
Lymphocytes Relative: 33 %
Lymphs Abs: 0.5 10*3/uL — ABNORMAL LOW (ref 0.7–4.0)
MCH: 38.4 pg — ABNORMAL HIGH (ref 26.0–34.0)
MCHC: 33.3 g/dL (ref 30.0–36.0)
MCV: 115.1 fL — ABNORMAL HIGH (ref 80.0–100.0)
Monocytes Absolute: 0.2 10*3/uL (ref 0.1–1.0)
Monocytes Relative: 12 %
Neutro Abs: 0.6 10*3/uL — ABNORMAL LOW (ref 1.7–7.7)
Neutrophils Relative %: 45 %
Platelets: 71 10*3/uL — ABNORMAL LOW (ref 150–400)
RBC: 2.45 MIL/uL — ABNORMAL LOW (ref 3.87–5.11)
RDW: 15.2 % (ref 11.5–15.5)
WBC: 1.4 10*3/uL — ABNORMAL LOW (ref 4.0–10.5)
nRBC: 0 % (ref 0.0–0.2)

## 2023-05-11 LAB — COMPREHENSIVE METABOLIC PANEL
ALT: 44 U/L (ref 0–44)
AST: 47 U/L — ABNORMAL HIGH (ref 15–41)
Albumin: 3.6 g/dL (ref 3.5–5.0)
Alkaline Phosphatase: 104 U/L (ref 38–126)
Anion gap: 7 (ref 5–15)
BUN: 7 mg/dL (ref 6–20)
CO2: 25 mmol/L (ref 22–32)
Calcium: 8.8 mg/dL — ABNORMAL LOW (ref 8.9–10.3)
Chloride: 107 mmol/L (ref 98–111)
Creatinine, Ser: 0.45 mg/dL (ref 0.44–1.00)
GFR, Estimated: >60 mL/min (ref >60)
Glucose, Bld: 122 mg/dL — ABNORMAL HIGH (ref 70–99)
Potassium: 4.3 mmol/L (ref 3.5–5.1)
Sodium: 139 mmol/L (ref 135–145)
Total Bilirubin: 2.5 mg/dL — ABNORMAL HIGH (ref 0.3–1.2)
Total Protein: 6.9 g/dL (ref 6.5–8.1)

## 2023-05-11 LAB — ABO/RH: ABO/RH(D): AB POS

## 2023-05-11 LAB — LACTATE DEHYDROGENASE: LDH: 230 U/L — ABNORMAL HIGH (ref 98–192)

## 2023-05-11 LAB — SEDIMENTATION RATE: Sed Rate: 25 mm/hr — ABNORMAL HIGH (ref 0–22)

## 2023-05-11 NOTE — Assessment & Plan Note (Signed)
Her anemia and thrombocytopenia improving She remained neutropenic Observe closely Continue neutropenic precaution If her white count remain low despite stopping Imuran for a month, I might have to consider giving her G-CSF support

## 2023-05-11 NOTE — Telephone Encounter (Signed)
-----   Message from Artis Delay, MD sent at 05/11/2023  8:30 AM EDT ----- Pls let her know AST is a bit higher than baseline but could be due to antibiotics Monitor as discussed next week

## 2023-05-11 NOTE — Progress Notes (Signed)
[facility] OFFICE PROGRESS NOTE  [name], PA-C  ASSESSMENT & PLAN:  Febrile neutropenia (HCC) All her cultures are negative She will complete antibiotics tomorrow Continue neutropenic precaution  Pancytopenia (HCC) Her anemia and thrombocytopenia improving She remained neutropenic Observe closely Continue neutropenic precaution If her white count remain low despite stopping Imuran for a month, I might have to consider giving her G-CSF support  Autoimmune hepatitis (HCC) Imuran is on hold Her liver enzymes are stable except for borderline elevated AST that could be related to antibiotics We will continue to observe and repeat next week  No orders of the defined types were placed in this encounter.   The total time spent in the appointment was 20 minutes encounter with patients including review of chart and various tests results, discussions about plan of care and coordination of care plan   All questions were answered. The patient knows to call the clinic with any problems, questions or concerns. No barriers to learning was detected.    Artis Delay, MD 6/28/202411:26 AM  INTERVAL HISTORY: Linda [name] 37 y.o. female returns for hospital follow-up She was diagnosed with neutropenic fever caused by chronic immunosuppressive therapy for autoimmune hepatitis She is doing well since discharge No recent bleeding Denies fever or chills All her cultures are negative  SUMMARY OF HEMATOLOGIC HISTORY: Please see my consult note dated 05/04/2023 for further details.  The patient was seen due to neutropenic fever Linda [name] 37 y.o. female is admitted overnight for neutropenic fever She has background history of autoimmune hepatitis and liver cirrhosis She has been on high dose Imuran for 2 years Her baseline labs from 2017 and 2022 was normal She has very frequent blood count monitoring through the [facility] clinic, results are reviewed from Care Everywhere. She  has chronic pancytopenia for a very long time and has not need transfusions. She never have infections from pancytopenia She bruises easily The patient denies any recent signs or symptoms of bleeding such as spontaneous epistaxis, hematuria or hematochezia. She has no menses for last 7 years due to IUD She is not symptomatic from anemia such as dyspnea, chest pain or presyncopal episodes She drinks rarely Last month, due to worsening pancytopenia, she was directed to ER, not admission Imuran is on hold  I have reviewed the past medical history, past surgical history, social history and family history with the patient and they are unchanged from previous note.  ALLERGIES:  is allergic to other.  MEDICATIONS:  Current Outpatient Medications  Medication Sig Dispense Refill   amoxicillin-clavulanate (AUGMENTIN) 875-125 MG tablet Take 1 tablet by mouth 2 (two) times daily for 7 days. 14 tablet 0   ibuprofen (ADVIL) 200 MG tablet Take 200 mg by mouth every 6 (six) hours as needed for fever.     Insulin Lispro-aabc (LYUMJEV KWIKPEN) 100 UNIT/ML KwikPen INJECT 1 UNIT UNDER THE SKIN PER 15 GRAMS CARBOHYDRATES PLUS SLIDING SCALE. ADD 1 UNIT PER GLUCOSE 50 ABOVE 150. TOTAL 30 UNITS/DAY.     levonorgestrel (MIRENA, 52 MG,) 20 MCG/24HR IUD Mirena 20 mcg/24 hours (7 yrs) 52 mg intrauterine device  Take by intrauterine route.     TOUJEO SOLOSTAR 300 UNIT/ML Solostar Pen Inject 16 Units into the skin at bedtime.     No current facility-administered medications for this visit.     REVIEW OF SYSTEMS:   Constitutional: Denies fevers, chills or night sweats Eyes: Denies blurriness of vision Ears, nose, mouth, throat, and face: Denies mucositis  or sore throat Respiratory: Denies cough, dyspnea or wheezes Cardiovascular: Denies palpitation, chest discomfort or lower extremity swelling Gastrointestinal:  Denies nausea, heartburn or change in bowel habits Skin: Denies abnormal skin rashes Lymphatics:  Denies new lymphadenopathy or easy bruising Neurological:Denies numbness, tingling or new weaknesses Behavioral/Psych: Mood is stable, no new changes  All other systems were reviewed with the patient and are negative.  PHYSICAL EXAMINATION: ECOG PERFORMANCE STATUS: 0 - Asymptomatic  Vitals:   05/11/23 0810  BP: 114/60  Pulse: 71  Resp: 18  Temp: 98.3 F (36.8 C)  SpO2: 99%   Filed Weights   05/11/23 0810  Weight: 171 lb 3.2 oz (77.7 kg)    GENERAL:alert, no distress and comfortable SKIN: Noted spider nevi NEURO: alert & oriented x 3 with fluent speech, no focal motor/sensory deficits  LABORATORY DATA:  I have reviewed the data as listed     Component Value Date/Time   NA 139 05/11/2023 0744   K 4.3 05/11/2023 0744   CL 107 05/11/2023 0744   CO2 25 05/11/2023 0744   GLUCOSE 122 (H) 05/11/2023 0744   BUN 7 05/11/2023 0744   CREATININE 0.45 05/11/2023 0744   CALCIUM 8.8 (L) 05/11/2023 0744   PROT 6.9 05/11/2023 0744   ALBUMIN 3.6 05/11/2023 0744   AST 47 (H) 05/11/2023 0744   ALT 44 05/11/2023 0744   ALKPHOS 104 05/11/2023 0744   BILITOT 2.5 (H) 05/11/2023 0744   GFRNONAA >60 05/11/2023 0744    No results found for: "SPEP", "UPEP"  Lab Results  Component Value Date   WBC 1.4 (L) 05/11/2023   NEUTROABS 0.6 (L) 05/11/2023   HGB 9.4 (L) 05/11/2023   HCT 28.2 (L) 05/11/2023   MCV 115.1 (H) 05/11/2023   PLT 71 (L) 05/11/2023      Chemistry      Component Value Date/Time   NA 139 05/11/2023 0744   K 4.3 05/11/2023 0744   CL 107 05/11/2023 0744   CO2 25 05/11/2023 0744   BUN 7 05/11/2023 0744   CREATININE 0.45 05/11/2023 0744      Component Value Date/Time   CALCIUM 8.8 (L) 05/11/2023 0744   ALKPHOS 104 05/11/2023 0744   AST 47 (H) 05/11/2023 0744   ALT 44 05/11/2023 0744   BILITOT 2.5 (H) 05/11/2023 2956

## 2023-05-11 NOTE — Assessment & Plan Note (Signed)
All her cultures are negative She will complete antibiotics tomorrow Continue neutropenic precaution

## 2023-05-11 NOTE — Telephone Encounter (Signed)
Called and left below message. Ask her to call the office back for questions. 

## 2023-05-11 NOTE — Assessment & Plan Note (Signed)
Imuran is on hold Her liver enzymes are stable except for borderline elevated AST that could be related to antibiotics We will continue to observe and repeat next week

## 2023-05-12 LAB — MISC LABCORP TEST (SEND OUT): Labcorp test code: 512300

## 2023-05-13 LAB — ERYTHROPOIETIN: Erythropoietin: 319.8 m[IU]/mL — ABNORMAL HIGH (ref 2.6–18.5)

## 2023-05-18 ENCOUNTER — Encounter: Payer: Self-pay | Admitting: Hematology and Oncology

## 2023-05-18 ENCOUNTER — Inpatient Hospital Stay: Payer: 59 | Attending: Hematology and Oncology

## 2023-05-18 ENCOUNTER — Other Ambulatory Visit: Payer: Self-pay

## 2023-05-18 ENCOUNTER — Inpatient Hospital Stay (HOSPITAL_BASED_OUTPATIENT_CLINIC_OR_DEPARTMENT_OTHER): Payer: 59 | Admitting: Hematology and Oncology

## 2023-05-18 VITALS — BP 118/71 | HR 86 | Temp 98.6°F | Resp 16 | Wt 165.2 lb

## 2023-05-18 DIAGNOSIS — D61818 Other pancytopenia: Secondary | ICD-10-CM | POA: Insufficient documentation

## 2023-05-18 DIAGNOSIS — K7460 Unspecified cirrhosis of liver: Secondary | ICD-10-CM | POA: Diagnosis not present

## 2023-05-18 DIAGNOSIS — Z794 Long term (current) use of insulin: Secondary | ICD-10-CM | POA: Insufficient documentation

## 2023-05-18 DIAGNOSIS — E109 Type 1 diabetes mellitus without complications: Secondary | ICD-10-CM | POA: Insufficient documentation

## 2023-05-18 DIAGNOSIS — Z79624 Long term (current) use of inhibitors of nucleotide synthesis: Secondary | ICD-10-CM | POA: Diagnosis not present

## 2023-05-18 DIAGNOSIS — Z7952 Long term (current) use of systemic steroids: Secondary | ICD-10-CM | POA: Insufficient documentation

## 2023-05-18 DIAGNOSIS — K754 Autoimmune hepatitis: Secondary | ICD-10-CM | POA: Diagnosis not present

## 2023-05-18 LAB — CBC WITH DIFFERENTIAL/PLATELET
Abs Immature Granulocytes: 0.01 10*3/uL (ref 0.00–0.07)
Basophils Absolute: 0 10*3/uL (ref 0.0–0.1)
Basophils Relative: 1 %
Eosinophils Absolute: 0.4 10*3/uL (ref 0.0–0.5)
Eosinophils Relative: 17 %
HCT: 31 % — ABNORMAL LOW (ref 36.0–46.0)
Hemoglobin: 10.3 g/dL — ABNORMAL LOW (ref 12.0–15.0)
Immature Granulocytes: 0 %
Lymphocytes Relative: 25 %
Lymphs Abs: 0.6 10*3/uL — ABNORMAL LOW (ref 0.7–4.0)
MCH: 36.5 pg — ABNORMAL HIGH (ref 26.0–34.0)
MCHC: 33.2 g/dL (ref 30.0–36.0)
MCV: 109.9 fL — ABNORMAL HIGH (ref 80.0–100.0)
Monocytes Absolute: 0.2 10*3/uL (ref 0.1–1.0)
Monocytes Relative: 7 %
Neutro Abs: 1.3 10*3/uL — ABNORMAL LOW (ref 1.7–7.7)
Neutrophils Relative %: 50 %
Platelets: 65 10*3/uL — ABNORMAL LOW (ref 150–400)
RBC: 2.82 MIL/uL — ABNORMAL LOW (ref 3.87–5.11)
RDW: 14.3 % (ref 11.5–15.5)
WBC: 2.6 10*3/uL — ABNORMAL LOW (ref 4.0–10.5)
nRBC: 0 % (ref 0.0–0.2)

## 2023-05-18 LAB — COMPREHENSIVE METABOLIC PANEL
ALT: 67 U/L — ABNORMAL HIGH (ref 0–44)
AST: 72 U/L — ABNORMAL HIGH (ref 15–41)
Albumin: 3.6 g/dL (ref 3.5–5.0)
Alkaline Phosphatase: 128 U/L — ABNORMAL HIGH (ref 38–126)
Anion gap: 7 (ref 5–15)
BUN: 8 mg/dL (ref 6–20)
CO2: 24 mmol/L (ref 22–32)
Calcium: 8.6 mg/dL — ABNORMAL LOW (ref 8.9–10.3)
Chloride: 108 mmol/L (ref 98–111)
Creatinine, Ser: 0.56 mg/dL (ref 0.44–1.00)
GFR, Estimated: >60 mL/min (ref >60)
Glucose, Bld: 106 mg/dL — ABNORMAL HIGH (ref 70–99)
Potassium: 4.1 mmol/L (ref 3.5–5.1)
Sodium: 139 mmol/L (ref 135–145)
Total Bilirubin: 1.5 mg/dL — ABNORMAL HIGH (ref 0.3–1.2)
Total Protein: 6.5 g/dL (ref 6.5–8.1)

## 2023-05-18 MED ORDER — AZATHIOPRINE 50 MG PO TABS: 100.0000 mg | ORAL_TABLET | Freq: Every day | ORAL | 3 refills | Status: DC

## 2023-05-18 NOTE — Assessment & Plan Note (Signed)
Her pancytopenia is improving since discontinuation of Imuran We are ready to resume treatment She will continue neutropenic precaution She does not need transfusion support for anemia or thrombocytopenia

## 2023-05-18 NOTE — Progress Notes (Signed)
[facility] OFFICE PROGRESS NOTE  Linda Canary, PA-C  ASSESSMENT & PLAN:  Pancytopenia [facility]) Her pancytopenia is improving since discontinuation of Imuran We are ready to resume treatment She will continue neutropenic precaution She does not need transfusion support for anemia or thrombocytopenia  Autoimmune hepatitis (HCC) Since her treatment was placed on hold, her autoimmune hepatitis is getting a bit worse We will resume Imuran at half the dose as before (baseline 200 mg) at 100 mg daily I refilled her prescription today  No orders of the defined types were placed in this encounter.   The total time spent in the appointment was 25 minutes encounter with patients including review of chart and various tests results, discussions about plan of care and coordination of care plan   All questions were answered. The patient knows to call the clinic with any problems, questions or concerns. No barriers to learning was detected.    Artis Delay, MD 7/5/20243:38 PM  INTERVAL HISTORY: Linda [name] 37 y.o. female returns for further follow-up She is doing well She have no signs or symptoms of fever or bleeding  SUMMARY OF HEMATOLOGIC HISTORY:  Please see my consult note dated 05/04/2023 for further details.  The patient was seen due to neutropenic fever Linda [name] 37 y.o. female is admitted overnight for neutropenic fever She has background history of autoimmune hepatitis and liver cirrhosis She has been on high dose Imuran for 2 years Her baseline labs from 2017 and 2022 was normal She has very frequent blood count monitoring through the [facility] clinic, results are reviewed from [facility] Everywhere. She has chronic pancytopenia for a very long time and has not need transfusions. She never have infections from pancytopenia She bruises easily The patient denies any recent signs or symptoms of bleeding such as spontaneous epistaxis, hematuria or hematochezia. She has no  menses for last 7 years due to IUD She is not symptomatic from anemia such as dyspnea, chest pain or presyncopal episodes She drinks rarely Last month, due to worsening pancytopenia, she was directed to ER, not admission Imuran is on hold, resume on 05/18/2023  I have reviewed the past medical history, past surgical history, social history and family history with the patient and they are unchanged from previous note.  ALLERGIES:  is allergic to other.  MEDICATIONS:  Current Outpatient Medications  Medication Sig Dispense Refill   azaTHIOprine (IMURAN) 50 MG tablet Take 2 tablets (100 mg total) by mouth daily. 60 tablet 3   ibuprofen (ADVIL) 200 MG tablet Take 200 mg by mouth every 6 (six) hours as needed for fever.     Insulin Lispro-aabc (LYUMJEV KWIKPEN) 100 UNIT/ML KwikPen INJECT 1 UNIT UNDER THE SKIN PER 15 GRAMS CARBOHYDRATES PLUS SLIDING SCALE. ADD 1 UNIT PER GLUCOSE 50 ABOVE 150. TOTAL 30 UNITS/DAY.     levonorgestrel (MIRENA, 52 MG,) 20 MCG/24HR IUD Mirena 20 mcg/24 hours (7 yrs) 52 mg intrauterine device  Take by intrauterine route.     TOUJEO SOLOSTAR 300 UNIT/ML Solostar Pen Inject 16 Units into the skin at bedtime.     No current facility-administered medications for this visit.     REVIEW OF SYSTEMS:   Constitutional: Denies fevers, chills or night sweats Eyes: Denies blurriness of vision Ears, nose, mouth, throat, and face: Denies mucositis or sore throat Respiratory: Denies cough, dyspnea or wheezes Cardiovascular: Denies palpitation, chest discomfort or lower extremity swelling Gastrointestinal:  Denies nausea, heartburn or change in bowel habits Skin: Denies abnormal  skin rashes Lymphatics: Denies new lymphadenopathy or easy bruising Neurological:Denies numbness, tingling or new weaknesses Behavioral/Psych: Mood is stable, no new changes  All other systems were reviewed with the patient and are negative.  PHYSICAL EXAMINATION: ECOG PERFORMANCE STATUS: 0 -  Asymptomatic  Vitals:   05/18/23 0928  BP: 118/71  Pulse: 86  Resp: 16  Temp: 98.6 F (37 C)  SpO2: 100%   Filed Weights   05/18/23 0928  Weight: 165 lb 3.2 oz (74.9 kg)    GENERAL:alert, no distress and comfortable  NEURO: alert & oriented x 3 with fluent speech, no focal motor/sensory deficits  LABORATORY DATA:  I have reviewed the data as listed     Component Value Date/Time   NA 139 05/18/2023 0900   K 4.1 05/18/2023 0900   CL 108 05/18/2023 0900   CO2 24 05/18/2023 0900   GLUCOSE 106 (H) 05/18/2023 0900   BUN 8 05/18/2023 0900   CREATININE 0.56 05/18/2023 0900   CALCIUM 8.6 (L) 05/18/2023 0900   PROT 6.5 05/18/2023 0900   ALBUMIN 3.6 05/18/2023 0900   AST 72 (H) 05/18/2023 0900   ALT 67 (H) 05/18/2023 0900   ALKPHOS 128 (H) 05/18/2023 0900   BILITOT 1.5 (H) 05/18/2023 0900   GFRNONAA >60 05/18/2023 0900    No results found for: "SPEP", "UPEP"  Lab Results  Component Value Date   WBC 2.6 (L) 05/18/2023   NEUTROABS 1.3 (L) 05/18/2023   HGB 10.3 (L) 05/18/2023   HCT 31.0 (L) 05/18/2023   MCV 109.9 (H) 05/18/2023   PLT 65 (L) 05/18/2023      Chemistry      Component Value Date/Time   NA 139 05/18/2023 0900   K 4.1 05/18/2023 0900   CL 108 05/18/2023 0900   CO2 24 05/18/2023 0900   BUN 8 05/18/2023 0900   CREATININE 0.56 05/18/2023 0900      Component Value Date/Time   CALCIUM 8.6 (L) 05/18/2023 0900   ALKPHOS 128 (H) 05/18/2023 0900   AST 72 (H) 05/18/2023 0900   ALT 67 (H) 05/18/2023 0900   BILITOT 1.5 (H) 05/18/2023 0900

## 2023-05-18 NOTE — Assessment & Plan Note (Signed)
Since her treatment was placed on hold, her autoimmune hepatitis is getting a bit worse We will resume Imuran at half the dose as before (baseline 200 mg) at 100 mg daily I refilled her prescription today

## 2023-05-24 ENCOUNTER — Telehealth: Payer: Self-pay

## 2023-05-24 NOTE — Telephone Encounter (Signed)
Notified Patient of completion of FMLA Forms. Fax transmission confirmation received. Copy of forms placed for pick-up as requested by Patient.

## 2023-05-29 ENCOUNTER — Inpatient Hospital Stay (HOSPITAL_BASED_OUTPATIENT_CLINIC_OR_DEPARTMENT_OTHER): Payer: 59 | Admitting: Hematology and Oncology

## 2023-05-29 ENCOUNTER — Other Ambulatory Visit: Payer: Self-pay

## 2023-05-29 ENCOUNTER — Inpatient Hospital Stay: Payer: 59

## 2023-05-29 VITALS — BP 109/59 | HR 77 | Temp 99.0°F | Resp 18 | Ht 70.0 in | Wt 167.4 lb

## 2023-05-29 DIAGNOSIS — K754 Autoimmune hepatitis: Secondary | ICD-10-CM | POA: Diagnosis not present

## 2023-05-29 DIAGNOSIS — D61818 Other pancytopenia: Secondary | ICD-10-CM

## 2023-05-29 DIAGNOSIS — E109 Type 1 diabetes mellitus without complications: Secondary | ICD-10-CM | POA: Diagnosis not present

## 2023-05-29 LAB — CBC WITH DIFFERENTIAL/PLATELET
Abs Immature Granulocytes: 0 10*3/uL (ref 0.00–0.07)
Basophils Absolute: 0 10*3/uL (ref 0.0–0.1)
Basophils Relative: 1 %
Eosinophils Absolute: 0.8 10*3/uL — ABNORMAL HIGH (ref 0.0–0.5)
Eosinophils Relative: 35 %
HCT: 30 % — ABNORMAL LOW (ref 36.0–46.0)
Hemoglobin: 10.4 g/dL — ABNORMAL LOW (ref 12.0–15.0)
Immature Granulocytes: 0 %
Lymphocytes Relative: 21 %
Lymphs Abs: 0.5 10*3/uL — ABNORMAL LOW (ref 0.7–4.0)
MCH: 36.4 pg — ABNORMAL HIGH (ref 26.0–34.0)
MCHC: 34.7 g/dL (ref 30.0–36.0)
MCV: 104.9 fL — ABNORMAL HIGH (ref 80.0–100.0)
Monocytes Absolute: 0.2 10*3/uL (ref 0.1–1.0)
Monocytes Relative: 7 %
Neutro Abs: 0.8 10*3/uL — ABNORMAL LOW (ref 1.7–7.7)
Neutrophils Relative %: 36 %
Platelets: 59 10*3/uL — ABNORMAL LOW (ref 150–400)
RBC: 2.86 MIL/uL — ABNORMAL LOW (ref 3.87–5.11)
RDW: 14.4 % (ref 11.5–15.5)
WBC: 2.2 10*3/uL — ABNORMAL LOW (ref 4.0–10.5)
nRBC: 0 % (ref 0.0–0.2)

## 2023-05-29 LAB — COMPREHENSIVE METABOLIC PANEL
ALT: 86 U/L — ABNORMAL HIGH (ref 0–44)
AST: 94 U/L — ABNORMAL HIGH (ref 15–41)
Albumin: 3.4 g/dL — ABNORMAL LOW (ref 3.5–5.0)
Alkaline Phosphatase: 145 U/L — ABNORMAL HIGH (ref 38–126)
Anion gap: 6 (ref 5–15)
BUN: 10 mg/dL (ref 6–20)
CO2: 24 mmol/L (ref 22–32)
Calcium: 8.8 mg/dL — ABNORMAL LOW (ref 8.9–10.3)
Chloride: 106 mmol/L (ref 98–111)
Creatinine, Ser: 0.42 mg/dL — ABNORMAL LOW (ref 0.44–1.00)
GFR, Estimated: >60 mL/min (ref >60)
Glucose, Bld: 200 mg/dL — ABNORMAL HIGH (ref 70–99)
Potassium: 3.9 mmol/L (ref 3.5–5.1)
Sodium: 136 mmol/L (ref 135–145)
Total Bilirubin: 1.9 mg/dL — ABNORMAL HIGH (ref 0.3–1.2)
Total Protein: 6.7 g/dL (ref 6.5–8.1)

## 2023-05-29 MED ORDER — PREDNISONE 10 MG PO TABS: 20.0000 mg | ORAL_TABLET | Freq: Every day | ORAL | 1 refills | Status: DC

## 2023-05-31 ENCOUNTER — Encounter: Payer: Self-pay | Admitting: Hematology and Oncology

## 2023-05-31 NOTE — Assessment & Plan Note (Signed)
She has well-controlled type 1 insulin-dependent diabetes but I anticipate worsening control with addition of prednisone She will be vigilant on monitoring her blood sugar and an additional insulin as needed

## 2023-05-31 NOTE — Assessment & Plan Note (Signed)
Unfortunately, despite resumption of Imuran at half her prior dose, she has worsening pancytopenia again Her autoimmune hepatitis is also not well-controlled with reduced dose Imuran We discussed risk and benefits of adding prednisone and ultimately, she agree to add moderate dose prednisone I will see her again in 2 weeks for further follow-up She does not need transfusion support

## 2023-05-31 NOTE — Progress Notes (Signed)
[facility] OFFICE PROGRESS NOTE  Linda Canary, PA-C  ASSESSMENT & PLAN:  Pancytopenia (HCC) Unfortunately, despite resumption of Imuran at half her prior dose, she has worsening pancytopenia again Her autoimmune hepatitis is also not well-controlled with reduced dose Imuran We discussed risk and benefits of adding prednisone and ultimately, she agree to add moderate dose prednisone I will see her again in 2 weeks for further follow-up She does not need transfusion support  Autoimmune hepatitis (HCC) With reduced dose Imuran, her autoimmune hepatitis is not under control I recommend she reach out to her hepatologist for management In the meantime, she is in agreement to add moderate dose prednisone  DM (diabetes mellitus) (HCC) She has well-controlled type 1 insulin-dependent diabetes but I anticipate worsening control with addition of prednisone She will be vigilant on monitoring her blood sugar and an additional insulin as needed  No orders of the defined types were placed in this encounter.   The total time spent in the appointment was 40 minutes encounter with patients including review of chart and various tests results, discussions about plan of care and coordination of care plan   All questions were answered. The patient knows to call the clinic with any problems, questions or concerns. No barriers to learning was detected.    Artis Delay, MD 7/18/20247:33 AM  INTERVAL HISTORY: Linda [name] 37 y.o. female returns for further follow-up, on Imuran for autoimmune hepatitis complicated by severe pancytopenia She is asymptomatic She tolerated treatment well Denies recent fever, chills or bleeding We spent a lot of time reviewing test results and discussed management for severe worsening pancytopenia and autoimmune hepatitis  SUMMARY OF HEMATOLOGIC HISTORY:  Please see my consult note dated 05/04/2023 for further details.  The patient was seen due to  neutropenic fever Linda [name] 37 y.o. female is admitted overnight for neutropenic fever She has background history of autoimmune hepatitis and liver cirrhosis She has been on high dose Imuran for 2 years Her baseline labs from 2017 and 2022 was normal She has very frequent blood count monitoring through the [facility] clinic, results are reviewed from [facility] Everywhere. She has chronic pancytopenia for a very long time and has not need transfusions. She never have infections from pancytopenia She bruises easily The patient denies any recent signs or symptoms of bleeding such as spontaneous epistaxis, hematuria or hematochezia. She has no menses for last 7 years due to IUD She is not symptomatic from anemia such as dyspnea, chest pain or presyncopal episodes She drinks rarely Last month, due to worsening pancytopenia, she was directed to ER, not admission Imuran is on hold, resume on 05/18/2023 at half of prior dose of Imuran of 100 mg daily On 05/29/2023, 40 mg of prednisone is added  I have reviewed the past medical history, past surgical history, social history and family history with the patient and they are unchanged from previous note.  ALLERGIES:  is allergic to other.  MEDICATIONS:  Current Outpatient Medications  Medication Sig Dispense Refill   predniSONE (DELTASONE) 10 MG tablet Take 2 tablets (20 mg total) by mouth daily with breakfast. 60 tablet 1   azaTHIOprine (IMURAN) 50 MG tablet Take 2 tablets (100 mg total) by mouth daily. 60 tablet 3   ibuprofen (ADVIL) 200 MG tablet Take 200 mg by mouth every 6 (six) hours as needed for fever.     Insulin Lispro-aabc (LYUMJEV KWIKPEN) 100 UNIT/ML KwikPen INJECT 1 UNIT UNDER THE SKIN PER 15 GRAMS  CARBOHYDRATES PLUS SLIDING SCALE. ADD 1 UNIT PER GLUCOSE 50 ABOVE 150. TOTAL 30 UNITS/DAY.     levonorgestrel (MIRENA, 52 MG,) 20 MCG/24HR IUD Mirena 20 mcg/24 hours (7 yrs) 52 mg intrauterine device  Take by intrauterine route.     TOUJEO SOLOSTAR 300  UNIT/ML Solostar Pen Inject 16 Units into the skin at bedtime.     No current facility-administered medications for this visit.     REVIEW OF SYSTEMS:   Constitutional: Denies fevers, chills or night sweats Eyes: Denies blurriness of vision Ears, nose, mouth, throat, and face: Denies mucositis or sore throat Respiratory: Denies cough, dyspnea or wheezes Cardiovascular: Denies palpitation, chest discomfort or lower extremity swelling Gastrointestinal:  Denies nausea, heartburn or change in bowel habits Skin: Denies abnormal skin rashes Lymphatics: Denies new lymphadenopathy or easy bruising Neurological:Denies numbness, tingling or new weaknesses Behavioral/Psych: Mood is stable, no new changes  All other systems were reviewed with the patient and are negative.  PHYSICAL EXAMINATION: ECOG PERFORMANCE STATUS: 1 - Symptomatic but completely ambulatory  Vitals:   05/29/23 1356  BP: (!) 109/59  Pulse: 77  Resp: 18  Temp: 99 F (37.2 C)  SpO2: 98%   Filed Weights   05/29/23 1356  Weight: 167 lb 6.4 oz (75.9 kg)    GENERAL:alert, no distress and comfortable NEURO: alert & oriented x 3 with fluent speech, no focal motor/sensory deficits  LABORATORY DATA:  I have reviewed the data as listed     Component Value Date/Time   NA 136 05/29/2023 1319   K 3.9 05/29/2023 1319   CL 106 05/29/2023 1319   CO2 24 05/29/2023 1319   GLUCOSE 200 (H) 05/29/2023 1319   BUN 10 05/29/2023 1319   CREATININE 0.42 (L) 05/29/2023 1319   CALCIUM 8.8 (L) 05/29/2023 1319   PROT 6.7 05/29/2023 1319   ALBUMIN 3.4 (L) 05/29/2023 1319   AST 94 (H) 05/29/2023 1319   ALT 86 (H) 05/29/2023 1319   ALKPHOS 145 (H) 05/29/2023 1319   BILITOT 1.9 (H) 05/29/2023 1319   GFRNONAA >60 05/29/2023 1319    No results found for: "SPEP", "UPEP"  Lab Results  Component Value Date   WBC 2.2 (L) 05/29/2023   NEUTROABS 0.8 (L) 05/29/2023   HGB 10.4 (L) 05/29/2023   HCT 30.0 (L) 05/29/2023   MCV 104.9 (H)  05/29/2023   PLT 59 (L) 05/29/2023      Chemistry      Component Value Date/Time   NA 136 05/29/2023 1319   K 3.9 05/29/2023 1319   CL 106 05/29/2023 1319   CO2 24 05/29/2023 1319   BUN 10 05/29/2023 1319   CREATININE 0.42 (L) 05/29/2023 1319      Component Value Date/Time   CALCIUM 8.8 (L) 05/29/2023 1319   ALKPHOS 145 (H) 05/29/2023 1319   AST 94 (H) 05/29/2023 1319   ALT 86 (H) 05/29/2023 1319   BILITOT 1.9 (H) 05/29/2023 1319

## 2023-05-31 NOTE — Assessment & Plan Note (Signed)
With reduced dose Imuran, her autoimmune hepatitis is not under control I recommend she reach out to her hepatologist for management In the meantime, she is in agreement to add moderate dose prednisone

## 2023-06-12 ENCOUNTER — Encounter: Payer: Self-pay | Admitting: Hematology and Oncology

## 2023-06-12 ENCOUNTER — Telehealth: Payer: Self-pay

## 2023-06-12 ENCOUNTER — Other Ambulatory Visit: Payer: Self-pay

## 2023-06-12 ENCOUNTER — Inpatient Hospital Stay: Payer: 59

## 2023-06-12 ENCOUNTER — Inpatient Hospital Stay: Payer: 59 | Admitting: Hematology and Oncology

## 2023-06-12 VITALS — BP 101/52 | HR 81 | Temp 98.7°F | Resp 18 | Ht 70.0 in | Wt 170.2 lb

## 2023-06-12 DIAGNOSIS — D61818 Other pancytopenia: Secondary | ICD-10-CM | POA: Diagnosis not present

## 2023-06-12 DIAGNOSIS — E109 Type 1 diabetes mellitus without complications: Secondary | ICD-10-CM | POA: Diagnosis not present

## 2023-06-12 DIAGNOSIS — K754 Autoimmune hepatitis: Secondary | ICD-10-CM

## 2023-06-12 LAB — COMPREHENSIVE METABOLIC PANEL
ALT: 69 U/L — ABNORMAL HIGH (ref 0–44)
AST: 54 U/L — ABNORMAL HIGH (ref 15–41)
Albumin: 3.6 g/dL (ref 3.5–5.0)
Alkaline Phosphatase: 114 U/L (ref 38–126)
Anion gap: 5 (ref 5–15)
BUN: 11 mg/dL (ref 6–20)
CO2: 28 mmol/L (ref 22–32)
Calcium: 8.9 mg/dL (ref 8.9–10.3)
Chloride: 108 mmol/L (ref 98–111)
Creatinine, Ser: 0.51 mg/dL (ref 0.44–1.00)
GFR, Estimated: >60 mL/min (ref >60)
Glucose, Bld: 91 mg/dL (ref 70–99)
Potassium: 3.7 mmol/L (ref 3.5–5.1)
Sodium: 141 mmol/L (ref 135–145)
Total Bilirubin: 1.3 mg/dL — ABNORMAL HIGH (ref 0.3–1.2)
Total Protein: 7 g/dL (ref 6.5–8.1)

## 2023-06-12 LAB — CBC WITH DIFFERENTIAL/PLATELET
Abs Immature Granulocytes: 0 10*3/uL (ref 0.00–0.07)
Basophils Absolute: 0 10*3/uL (ref 0.0–0.1)
Basophils Relative: 1 %
Eosinophils Absolute: 0.3 10*3/uL (ref 0.0–0.5)
Eosinophils Relative: 16 %
HCT: 31.5 % — ABNORMAL LOW (ref 36.0–46.0)
Hemoglobin: 10.4 g/dL — ABNORMAL LOW (ref 12.0–15.0)
Immature Granulocytes: 0 %
Lymphocytes Relative: 26 %
Lymphs Abs: 0.4 10*3/uL — ABNORMAL LOW (ref 0.7–4.0)
MCH: 34.6 pg — ABNORMAL HIGH (ref 26.0–34.0)
MCHC: 33 g/dL (ref 30.0–36.0)
MCV: 104.7 fL — ABNORMAL HIGH (ref 80.0–100.0)
Monocytes Absolute: 0.2 10*3/uL (ref 0.1–1.0)
Monocytes Relative: 9 %
Neutro Abs: 0.8 10*3/uL — ABNORMAL LOW (ref 1.7–7.7)
Neutrophils Relative %: 48 %
Platelets: 52 10*3/uL — ABNORMAL LOW (ref 150–400)
RBC: 3.01 MIL/uL — ABNORMAL LOW (ref 3.87–5.11)
RDW: 15.2 % (ref 11.5–15.5)
WBC: 1.6 10*3/uL — ABNORMAL LOW (ref 4.0–10.5)
nRBC: 0 % (ref 0.0–0.2)

## 2023-06-12 NOTE — Telephone Encounter (Signed)
Called and given below message. She verbalized understanding. 

## 2023-06-12 NOTE — Telephone Encounter (Signed)
-----   Message from Artis Delay sent at 06/12/2023 10:49 AM EDT ----- Pls call her Her liver enzymes are better, continue current dose of prednisone

## 2023-06-12 NOTE — Assessment & Plan Note (Signed)
So far, she is able to control her diabetes well She will continue to be vigilant with blood sugar control while on prednisone

## 2023-06-12 NOTE — Assessment & Plan Note (Signed)
Unfortunately, despite resumption of Imuran at half her prior dose, she has worsening pancytopenia again I recommend further evaluation with MRI of the abdomen to evaluate the status of her liver fibrosis and evaluate for possible splenomegaly I am hopeful we do not have to perform bone marrow aspirate and biopsy I will see her again in 2 weeks for further follow-up She does not need transfusion support

## 2023-06-12 NOTE — Progress Notes (Signed)
[facility] OFFICE PROGRESS NOTE  [name], PA-C  ASSESSMENT & PLAN:  Pancytopenia (HCC) Unfortunately, despite resumption of Imuran at half her prior dose, she has worsening pancytopenia again I recommend further evaluation with MRI of the abdomen to evaluate the status of her liver fibrosis and evaluate for possible splenomegaly I am hopeful we do not have to perform bone marrow aspirate and biopsy I will see her again in 2 weeks for further follow-up She does not need transfusion support  Autoimmune hepatitis (HCC) Her liver enzymes have improved with addition of prednisone She will reach out to discuss plan of care with her hepatology team She will continue current dose of Imuran and prednisone  DM (diabetes mellitus) (HCC) So far, she is able to control her diabetes well She will continue to be vigilant with blood sugar control while on prednisone  Orders Placed This Encounter  Procedures   MR Abdomen W Wo Contrast    Standing Status:   Future    Standing Expiration Date:   06/11/2024    Order Specific Question:   If indicated for the ordered procedure, I authorize the administration of contrast media per Radiology protocol    Answer:   Yes    Order Specific Question:   What is the patient's sedation requirement?    Answer:   No Sedation    Order Specific Question:   Does the patient have a pacemaker or implanted devices?    Answer:   No    Order Specific Question:   Preferred imaging location?    Answer:   [facility] (table limit - 550 lbs)    The total time spent in the appointment was 30 minutes encounter with patients including review of chart and various tests results, discussions about plan of care and coordination of care plan   All questions were answered. The patient knows to call the clinic with any problems, questions or concerns. No barriers to learning was detected.    Artis Delay, MD 7/30/202411:13 AM  INTERVAL  HISTORY: Linda [name] 37 y.o. female returns for further follow-up for autoimmune hepatitis, pancytopenia on chronic immunosuppressant therapy She is able to control her blood sugar while on prednisone We discussed test results She denies recent infection or bleeding  SUMMARY OF HEMATOLOGIC HISTORY:  Please see my consult note dated 05/04/2023 for further details.  The patient was seen due to neutropenic fever Linda [name] 37 y.o. female is admitted overnight for neutropenic fever She has background history of autoimmune hepatitis and liver cirrhosis She has been on high dose Imuran for 2 years Her baseline labs from 2017 and 2022 was normal She has very frequent blood count monitoring through the Gi clinic, results are reviewed from Care Everywhere. She has chronic pancytopenia for a very long time and has not need transfusions. She never have infections from pancytopenia She bruises easily The patient denies any recent signs or symptoms of bleeding such as spontaneous epistaxis, hematuria or hematochezia. She has no menses for last 7 years due to IUD She is not symptomatic from anemia such as dyspnea, chest pain or presyncopal episodes She drinks rarely Last month, due to worsening pancytopenia, she was directed to ER, not admission Imuran is on hold, resume on 05/18/2023 at half of prior dose of Imuran of 100 mg daily On 05/29/2023, 40 mg of prednisone is added  I have reviewed the past medical history, past surgical history, social history and family history  with the patient and they are unchanged from previous note.  ALLERGIES:  is allergic to other.  MEDICATIONS:  Current Outpatient Medications  Medication Sig Dispense Refill   azaTHIOprine (IMURAN) 50 MG tablet Take 2 tablets (100 mg total) by mouth daily. 60 tablet 3   ibuprofen (ADVIL) 200 MG tablet Take 200 mg by mouth every 6 (six) hours as needed for fever.     Insulin Lispro-aabc (LYUMJEV KWIKPEN) 100 UNIT/ML KwikPen INJECT 1  UNIT UNDER THE SKIN PER 15 GRAMS CARBOHYDRATES PLUS SLIDING SCALE. ADD 1 UNIT PER GLUCOSE 50 ABOVE 150. TOTAL 30 UNITS/DAY.     levonorgestrel (MIRENA, 52 MG,) 20 MCG/24HR IUD Mirena 20 mcg/24 hours (7 yrs) 52 mg intrauterine device  Take by intrauterine route.     predniSONE (DELTASONE) 10 MG tablet Take 2 tablets (20 mg total) by mouth daily with breakfast. 60 tablet 1   TOUJEO SOLOSTAR 300 UNIT/ML Solostar Pen Inject 16 Units into the skin at bedtime.     No current facility-administered medications for this visit.     REVIEW OF SYSTEMS:   Constitutional: Denies fevers, chills or night sweats Eyes: Denies blurriness of vision Ears, nose, mouth, throat, and face: Denies mucositis or sore throat Respiratory: Denies cough, dyspnea or wheezes Cardiovascular: Denies palpitation, chest discomfort or lower extremity swelling Gastrointestinal:  Denies nausea, heartburn or change in bowel habits Skin: Denies abnormal skin rashes Lymphatics: Denies new lymphadenopathy or easy bruising Neurological:Denies numbness, tingling or new weaknesses Behavioral/Psych: Mood is stable, no new changes  All other systems were reviewed with the patient and are negative.  PHYSICAL EXAMINATION: ECOG PERFORMANCE STATUS: 0 - Asymptomatic  Vitals:   06/12/23 1016  BP: (!) 101/52  Pulse: 81  Resp: 18  Temp: 98.7 F (37.1 C)  SpO2: 100%   Filed Weights   06/12/23 1016  Weight: 170 lb 3.2 oz (77.2 kg)    GENERAL:alert, no distress and comfortable NEURO: alert & oriented x 3 with fluent speech, no focal motor/sensory deficits  LABORATORY DATA:  I have reviewed the data as listed     Component Value Date/Time   NA 141 06/12/2023 0958   K 3.7 06/12/2023 0958   CL 108 06/12/2023 0958   CO2 28 06/12/2023 0958   GLUCOSE 91 06/12/2023 0958   BUN 11 06/12/2023 0958   CREATININE 0.51 06/12/2023 0958   CALCIUM 8.9 06/12/2023 0958   PROT 7.0 06/12/2023 0958   ALBUMIN 3.6 06/12/2023 0958   AST 54 (H)  06/12/2023 0958   ALT 69 (H) 06/12/2023 0958   ALKPHOS 114 06/12/2023 0958   BILITOT 1.3 (H) 06/12/2023 0958   GFRNONAA >60 06/12/2023 0958    No results found for: "SPEP", "UPEP"  Lab Results  Component Value Date   WBC 1.6 (L) 06/12/2023   NEUTROABS 0.8 (L) 06/12/2023   HGB 10.4 (L) 06/12/2023   HCT 31.5 (L) 06/12/2023   MCV 104.7 (H) 06/12/2023   PLT 52 (L) 06/12/2023      Chemistry      Component Value Date/Time   NA 141 06/12/2023 0958   K 3.7 06/12/2023 0958   CL 108 06/12/2023 0958   CO2 28 06/12/2023 0958   BUN 11 06/12/2023 0958   CREATININE 0.51 06/12/2023 0958      Component Value Date/Time   CALCIUM 8.9 06/12/2023 0958   ALKPHOS 114 06/12/2023 0958   AST 54 (H) 06/12/2023 0958   ALT 69 (H) 06/12/2023 0958   BILITOT 1.3 (H) 06/12/2023 7425

## 2023-06-12 NOTE — Assessment & Plan Note (Signed)
Her liver enzymes have improved with addition of prednisone She will reach out to discuss plan of care with her hepatology team She will continue current dose of Imuran and prednisone

## 2023-06-15 ENCOUNTER — Telehealth: Payer: Self-pay

## 2023-06-15 NOTE — Telephone Encounter (Signed)
Called and left a message asking her to call the office back. Ask her to call and schedule MRI. Asking if she has made a decision about following up with liver specialist.

## 2023-06-26 ENCOUNTER — Ambulatory Visit (HOSPITAL_COMMUNITY)
Admission: RE | Admit: 2023-06-26 | Discharge: 2023-06-26 | Disposition: A | Discharge status: Home / Self Care | Payer: 59 | Source: Ambulatory Visit | Attending: Hematology and Oncology | Admitting: Hematology and Oncology

## 2023-06-26 DIAGNOSIS — D61818 Other pancytopenia: Secondary | ICD-10-CM | POA: Insufficient documentation

## 2023-06-26 DIAGNOSIS — K754 Autoimmune hepatitis: Secondary | ICD-10-CM | POA: Diagnosis present

## 2023-06-26 MED ORDER — GADOBUTROL 1 MMOL/ML IV SOLN
7.5000 mL | Freq: Once | INTRAVENOUS | Status: AC | PRN
  Administered 2023-06-26: 7.5 mL via INTRAVENOUS

## 2023-06-28 ENCOUNTER — Other Ambulatory Visit: Payer: Self-pay

## 2023-06-28 ENCOUNTER — Telehealth: Payer: Self-pay

## 2023-06-28 ENCOUNTER — Inpatient Hospital Stay: Payer: 59 | Admitting: Hematology and Oncology

## 2023-06-28 ENCOUNTER — Inpatient Hospital Stay: Payer: 59 | Attending: Hematology and Oncology

## 2023-06-28 ENCOUNTER — Encounter: Payer: Self-pay | Admitting: Hematology and Oncology

## 2023-06-28 VITALS — BP 120/64 | HR 73 | Temp 98.5°F | Resp 18 | Ht 70.0 in | Wt 169.2 lb

## 2023-06-28 DIAGNOSIS — D61818 Other pancytopenia: Secondary | ICD-10-CM | POA: Diagnosis present

## 2023-06-28 DIAGNOSIS — E119 Type 2 diabetes mellitus without complications: Secondary | ICD-10-CM | POA: Insufficient documentation

## 2023-06-28 DIAGNOSIS — K754 Autoimmune hepatitis: Secondary | ICD-10-CM

## 2023-06-28 DIAGNOSIS — E109 Type 1 diabetes mellitus without complications: Secondary | ICD-10-CM | POA: Diagnosis not present

## 2023-06-28 LAB — COMPREHENSIVE METABOLIC PANEL
ALT: 43 U/L (ref 0–44)
AST: 34 U/L (ref 15–41)
Albumin: 3.7 g/dL (ref 3.5–5.0)
Alkaline Phosphatase: 91 U/L (ref 38–126)
Anion gap: 6 (ref 5–15)
BUN: 9 mg/dL (ref 6–20)
CO2: 27 mmol/L (ref 22–32)
Calcium: 8.5 mg/dL — ABNORMAL LOW (ref 8.9–10.3)
Chloride: 105 mmol/L (ref 98–111)
Creatinine, Ser: 0.51 mg/dL (ref 0.44–1.00)
GFR, Estimated: >60 mL/min (ref >60)
Glucose, Bld: 150 mg/dL — ABNORMAL HIGH (ref 70–99)
Potassium: 3.9 mmol/L (ref 3.5–5.1)
Sodium: 138 mmol/L (ref 135–145)
Total Bilirubin: 2.1 mg/dL — ABNORMAL HIGH (ref 0.3–1.2)
Total Protein: 6.8 g/dL (ref 6.5–8.1)

## 2023-06-28 LAB — CBC WITH DIFFERENTIAL/PLATELET
Abs Immature Granulocytes: 0 10*3/uL (ref 0.00–0.07)
Basophils Absolute: 0 10*3/uL (ref 0.0–0.1)
Basophils Relative: 1 %
Eosinophils Absolute: 0.1 10*3/uL (ref 0.0–0.5)
Eosinophils Relative: 6 %
HCT: 31.7 % — ABNORMAL LOW (ref 36.0–46.0)
Hemoglobin: 10.3 g/dL — ABNORMAL LOW (ref 12.0–15.0)
Immature Granulocytes: 0 %
Lymphocytes Relative: 30 %
Lymphs Abs: 0.4 10*3/uL — ABNORMAL LOW (ref 0.7–4.0)
MCH: 33.3 pg (ref 26.0–34.0)
MCHC: 32.5 g/dL (ref 30.0–36.0)
MCV: 102.6 fL — ABNORMAL HIGH (ref 80.0–100.0)
Monocytes Absolute: 0.1 10*3/uL (ref 0.1–1.0)
Monocytes Relative: 9 %
Neutro Abs: 0.7 10*3/uL — ABNORMAL LOW (ref 1.7–7.7)
Neutrophils Relative %: 54 %
Platelets: 55 10*3/uL — ABNORMAL LOW (ref 150–400)
RBC: 3.09 MIL/uL — ABNORMAL LOW (ref 3.87–5.11)
RDW: 14.9 % (ref 11.5–15.5)
WBC: 1.2 10*3/uL — ABNORMAL LOW (ref 4.0–10.5)
nRBC: 0 % (ref 0.0–0.2)

## 2023-06-28 NOTE — Assessment & Plan Note (Signed)
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

## 2023-06-28 NOTE — Assessment & Plan Note (Signed)
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

## 2023-06-28 NOTE — Telephone Encounter (Signed)
Called and left below message. Ask her to call the office back for questions. 

## 2023-06-28 NOTE — Telephone Encounter (Signed)
-----   Message from Artis Delay sent at 06/28/2023  9:22 AM EDT ----- Regarding: LFT is better Tell her LFT is normal Reduce prednisone to 10 mg and make changes to her med list

## 2023-06-28 NOTE — Progress Notes (Signed)
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   The total time spent in the appointment was 40 minutes encounter with patients including review of chart and various tests results, discussions about plan of care and coordination of care plan   All questions were answered. The patient knows to call the clinic with any problems, questions or concerns. No barriers to learning was detected.    Artis Delay, MD 8/15/202410:13 AM  INTERVAL HISTORY: Linda [name] 37 y.o. [name] returns for further follow-up for pancytopenia, autoimmune hepatitis She is doing well She denies major side effects from addition of prednisone I reviewed test results with the patient as well as discussed plan of care with her stepmother over the phone  SUMMARY OF HEMATOLOGIC HISTORY:  Please see my consult note dated 05/04/2023 for further details.  The patient was seen due to neutropenic fever Linda [name] 37 y.o. [name] is admitted  overnight for neutropenic fever She has background history of autoimmune hepatitis and liver cirrhosis She has been on high dose Imuran for 2 years Her baseline labs from 2017 and 2022 was normal She has very frequent blood count monitoring through the Gi clinic, results are reviewed from Care Everywhere. She has chronic pancytopenia for a very long time and has not need transfusions. She never have infections from pancytopenia She bruises easily The patient denies any recent signs or symptoms of bleeding such as spontaneous epistaxis, hematuria or hematochezia. She has no menses for last 7 years due to IUD She is not symptomatic from anemia such as dyspnea, chest pain or presyncopal episodes She drinks rarely Last month, due to worsening pancytopenia, she was directed to ER, not admission Imuran is on hold, resume on 05/18/2023 at half of prior dose of Imuran of 100 mg daily On 05/29/2023, 40 mg of prednisone is added On June 28, 2023, the dose of prednisone is reduced to 10 mg and she continues taking Imuran at 100 mg On June 26, 2023, MRI of the abdomen and showed   IMPRESSION: 1. 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 There is extensive venous collateralization in the left abdomen with paraesophageal varices evident. 2. Marked splenomegaly with estimated splenic volume of 2024 cc.  3. Small and irregular right hepatic lobe with subtle areas of bandlike T2 hyperintensity deep to foci of capsular retraction. These regions show delayed enhancement on postcontrast imaging, consistent with fibrosis. No focal suspicious enhancing mass lesion. 4. Trace perihepatic and perisplenic ascites. 5. Diffuse edema in the mesentery, right greater than left and  retroperitoneal space.  I have reviewed the past medical history, past surgical history, social history and family history with the patient and they are unchanged from previous note.  ALLERGIES:   is allergic to other.  MEDICATIONS:  Current Outpatient Medications  Medication Sig Dispense Refill   azaTHIOprine (IMURAN) 50 MG tablet Take 2 tablets (100 mg total) by mouth daily. 60 tablet 3   ibuprofen (ADVIL) 200 MG tablet Take 200 mg by mouth every 6 (six) hours as needed for fever.     Insulin Lispro-aabc (LYUMJEV KWIKPEN) 100 UNIT/ML KwikPen INJECT 1 UNIT UNDER THE SKIN PER 15 GRAMS CARBOHYDRATES PLUS SLIDING SCALE. ADD 1 UNIT PER GLUCOSE 50 ABOVE 150. TOTAL 30 UNITS/DAY.     levonorgestrel (MIRENA, 52 MG,) 20 MCG/24HR IUD Mirena 20 mcg/24 hours (7 yrs) 52 mg intrauterine device  Take by intrauterine route.     predniSONE (DELTASONE) 10 MG tablet Take 2 tablets (20 mg total) by mouth daily with breakfast. 60 tablet 1   TOUJEO SOLOSTAR 300 UNIT/ML Solostar Pen Inject 16 Units into the skin at bedtime.     No current facility-administered medications for this visit.     REVIEW OF SYSTEMS:   Constitutional: Denies fevers, chills or night sweats Eyes: Denies blurriness of vision Ears, nose, mouth, throat, and face: Denies mucositis or sore throat Respiratory: Denies cough, dyspnea or wheezes Cardiovascular: Denies palpitation, chest discomfort or lower extremity swelling Gastrointestinal:  Denies nausea, heartburn or change in bowel habits Skin: Denies abnormal skin rashes Lymphatics: Denies new lymphadenopathy or easy bruising Neurological:Denies numbness, tingling or new weaknesses Behavioral/Psych: Mood is stable, no new changes  All other systems were reviewed with the patient and are negative.  PHYSICAL EXAMINATION: ECOG PERFORMANCE STATUS: 1 - Symptomatic but completely ambulatory  Vitals:   06/28/23 0850  BP: 120/64  Pulse: 73  Resp: 18  Temp: 98.5 F (36.9 C)  SpO2: 99%   Filed Weights   06/28/23 0850  Weight: 169 lb 3.2 oz (76.7 kg)    GENERAL:alert, no distress and comfortable NEURO: alert & oriented x 3 with fluent speech, no focal motor/sensory  deficits  LABORATORY DATA:  I have reviewed the data as listed     Component Value Date/Time   NA 138 06/28/2023 0837   K 3.9 06/28/2023 0837   CL 105 06/28/2023 0837   CO2 27 06/28/2023 0837   GLUCOSE 150 (H) 06/28/2023 0837   BUN 9 06/28/2023 0837   CREATININE 0.51 06/28/2023 0837   CALCIUM 8.5 (L) 06/28/2023 0837   PROT 6.8 06/28/2023 0837   ALBUMIN 3.7 06/28/2023 0837   AST 34 06/28/2023 0837   ALT 43 06/28/2023 0837   ALKPHOS 91 06/28/2023 0837   BILITOT 2.1 (H) 06/28/2023 0837   GFRNONAA >60 06/28/2023 0837    No results found for: "SPEP", "UPEP"  Lab Results  Component Value Date   WBC 1.2 (L) 06/28/2023   NEUTROABS 0.7 (L) 06/28/2023   HGB 10.3 (L) 06/28/2023   HCT 31.7 (L) 06/28/2023   MCV 102.6 (H) 06/28/2023   PLT 55 (L) 06/28/2023      Chemistry      Component Value Date/Time   NA 138 06/28/2023 0837   K 3.9 06/28/2023 0837   CL 105 06/28/2023 0837   CO2 27 06/28/2023 0837   BUN 9 06/28/2023 0837   CREATININE 0.51 06/28/2023 0837      Component Value Date/Time   CALCIUM 8.5 (L) 06/28/2023 0837   ALKPHOS 91 06/28/2023 0837  AST 34 06/28/2023 0837   ALT 43 06/28/2023 0837   BILITOT 2.1 (H) 06/28/2023 0837       RADIOGRAPHIC STUDIES: I have reviewed imaging studies with the patient I have personally reviewed the radiological images as listed and agreed with the findings in the report. MR Abdomen W Wo Contrast  Result Date: 06/28/2023 CLINICAL DATA:  Autoimmune hepatitis with pancytopenia. Hepatic fibrosis. EXAM: MRI ABDOMEN WITHOUT AND WITH CONTRAST TECHNIQUE: Multiplanar multisequence MR imaging of the abdomen was performed both before and after the administration of intravenous contrast. CONTRAST:  7.99mL GADAVIST GADOBUTROL 1 MMOL/ML IV SOLN COMPARISON:  Abdominal ultrasound 12/16/2020. FINDINGS: Lower chest: Unremarkable. Hepatobiliary: Right hepatic lobe is small and irregular in appearance with subtle areas of bandlike T2 hyperintensities  identified deep to foci of capsular retraction. These region show delayed enhancement on postcontrast imaging, consistent with fibrosis. There is marked enlargement of the lateral segment left liver. No restricted diffusion or focal suspicious enhancing mass lesion. No evidence for gallstones. No intra or extrahepatic biliary duct dilatation. Extrahepatic common duct measures on the order of 6 mm. Pancreas: No focal mass lesion. No dilatation of the main duct. No intraparenchymal cyst. There is some edema in the anterior pararenal space, but diffuse edema is seen in the retroperitoneal tissues and mesentery. No peripancreatic edema. Spleen: Spleen is markedly enlarged with craniocaudal length of 21.7 cm x 13.9 x 11.4 cm. Estimated splenic volume is 2024 cc. Adrenals/Urinary Tract: No adrenal nodule or mass. Kidneys unremarkable. Stomach/Bowel: Stomach is unremarkable. No gastric wall thickening. No evidence of outlet obstruction. Duodenum is normally positioned as is the ligament of Treitz. No small bowel or colonic dilatation within the visualized abdomen. Vascular/Lymphatic: Portal vein appears chronically occluded with cavernous transformation in the porta hepatis. There is extensive venous collateralization in the left abdomen with paraesophageal varices evident. There is no gastrohepatic or hepatoduodenal ligament lymphadenopathy. No retroperitoneal or mesenteric lymphadenopathy. Other: There is some trace perihepatic and perisplenic ascites. As above, diffuse edema is seen in the mesentery, right greater than left and retroperitoneal space. Musculoskeletal: No focal suspicious marrow enhancement within the visualized bony anatomy. IMPRESSION: 1. 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 There is extensive venous collateralization in the left abdomen with paraesophageal varices evident. 2. Marked splenomegaly with estimated splenic volume of 2024 cc. 3. Small and  irregular right hepatic lobe with subtle areas of bandlike T2 hyperintensity deep to foci of capsular retraction. These regions show delayed enhancement on postcontrast imaging, consistent with fibrosis. No focal suspicious enhancing mass lesion. 4. Trace perihepatic and perisplenic ascites. 5. Diffuse edema in the mesentery, right greater than left and retroperitoneal space. Electronically Signed   By: Kennith Center M.D.   On: 06/28/2023 08:26

## 2023-06-28 NOTE — Assessment & Plan Note (Signed)
She is able to manage her diabetes well with dietary modification and insulin I am hopeful with reduced dose prednisone, her diabetes will be better controlled

## 2023-06-28 NOTE — Telephone Encounter (Signed)
Called [facility] at [phone] to confirm fax #. Faxed referral to [phone], received fax confirmation.

## 2023-07-11 ENCOUNTER — Telehealth: Payer: Self-pay | Admitting: Hematology and Oncology

## 2023-07-11 NOTE — Telephone Encounter (Signed)
Per Dr.Gorsuch, called pt with message below. Pt verbalized understanding 

## 2023-07-11 NOTE — Telephone Encounter (Signed)
-----   Message from Artis Delay sent at 07/11/2023 12:27 PM EDT ----- Regarding: follow-up I have reviewed her recent test results Her cbc looks good I recommend reduce prednisone to 10 mg every other day I will send LOS to see her back in 3 weeks

## 2023-07-13 ENCOUNTER — Ambulatory Visit: Payer: 59 | Admitting: Hematology and Oncology

## 2023-07-13 ENCOUNTER — Other Ambulatory Visit: Payer: 59

## 2023-07-25 ENCOUNTER — Encounter: Payer: Self-pay | Admitting: Hematology and Oncology

## 2023-08-02 ENCOUNTER — Inpatient Hospital Stay: Payer: 59 | Attending: Hematology and Oncology

## 2023-08-02 ENCOUNTER — Encounter: Payer: Self-pay | Admitting: Hematology and Oncology

## 2023-08-02 ENCOUNTER — Inpatient Hospital Stay: Payer: 59 | Admitting: Hematology and Oncology

## 2023-08-02 VITALS — BP 111/59 | HR 75 | Temp 98.7°F | Resp 18 | Ht 70.0 in | Wt 167.4 lb

## 2023-08-02 DIAGNOSIS — K754 Autoimmune hepatitis: Secondary | ICD-10-CM | POA: Insufficient documentation

## 2023-08-02 DIAGNOSIS — Z79624 Long term (current) use of inhibitors of nucleotide synthesis: Secondary | ICD-10-CM | POA: Insufficient documentation

## 2023-08-02 DIAGNOSIS — D61818 Other pancytopenia: Secondary | ICD-10-CM

## 2023-08-02 DIAGNOSIS — Z79631 Long term (current) use of antimetabolite agent: Secondary | ICD-10-CM | POA: Diagnosis not present

## 2023-08-02 LAB — CBC WITH DIFFERENTIAL/PLATELET
Abs Immature Granulocytes: 0 10*3/uL (ref 0.00–0.07)
Basophils Absolute: 0 10*3/uL (ref 0.0–0.1)
Basophils Relative: 1 %
Eosinophils Absolute: 0.1 10*3/uL (ref 0.0–0.5)
Eosinophils Relative: 5 %
HCT: 32.3 % — ABNORMAL LOW (ref 36.0–46.0)
Hemoglobin: 11 g/dL — ABNORMAL LOW (ref 12.0–15.0)
Immature Granulocytes: 0 %
Lymphocytes Relative: 31 %
Lymphs Abs: 0.3 10*3/uL — ABNORMAL LOW (ref 0.7–4.0)
MCH: 34 pg (ref 26.0–34.0)
MCHC: 34.1 g/dL (ref 30.0–36.0)
MCV: 99.7 fL (ref 80.0–100.0)
Monocytes Absolute: 0.1 10*3/uL (ref 0.1–1.0)
Monocytes Relative: 12 %
Neutro Abs: 0.5 10*3/uL — ABNORMAL LOW (ref 1.7–7.7)
Neutrophils Relative %: 51 %
Platelets: 46 10*3/uL — ABNORMAL LOW (ref 150–400)
RBC: 3.24 MIL/uL — ABNORMAL LOW (ref 3.87–5.11)
RDW: 17.2 % — ABNORMAL HIGH (ref 11.5–15.5)
Smear Review: NORMAL
WBC: 1 10*3/uL — ABNORMAL LOW (ref 4.0–10.5)
nRBC: 0 % (ref 0.0–0.2)

## 2023-08-02 LAB — COMPREHENSIVE METABOLIC PANEL
ALT: 31 U/L (ref 0–44)
AST: 29 U/L (ref 15–41)
Albumin: 3.8 g/dL (ref 3.5–5.0)
Alkaline Phosphatase: 80 U/L (ref 38–126)
Anion gap: 4 — ABNORMAL LOW (ref 5–15)
BUN: 10 mg/dL (ref 6–20)
CO2: 25 mmol/L (ref 22–32)
Calcium: 8.9 mg/dL (ref 8.9–10.3)
Chloride: 108 mmol/L (ref 98–111)
Creatinine, Ser: 0.57 mg/dL (ref 0.44–1.00)
GFR, Estimated: >60 mL/min (ref >60)
Glucose, Bld: 173 mg/dL — ABNORMAL HIGH (ref 70–99)
Potassium: 4 mmol/L (ref 3.5–5.1)
Sodium: 137 mmol/L (ref 135–145)
Total Bilirubin: 2.4 mg/dL — ABNORMAL HIGH (ref 0.3–1.2)
Total Protein: 6.8 g/dL (ref 6.5–8.1)

## 2023-08-02 NOTE — Assessment & Plan Note (Signed)
Despite worsening leukopenia and thrombocytopenia, her red blood cell count has improved She is asymptomatic I suspect the cause of her leukopenia and thrombocytopenia is due to sequestration not from bone marrow suppression She tolerated reduced dose of prednisone well without significant hyperglycemia Right now, the plan will be for her to continue on current dose of prednisone along with azathioprine

## 2023-08-02 NOTE — Progress Notes (Signed)
[name] OFFICE PROGRESS NOTE  Linda Canary, PA-C  ASSESSMENT & PLAN:  Pancytopenia [facility]) Despite worsening leukopenia and thrombocytopenia, her red blood cell count has improved She is asymptomatic I suspect the cause of her leukopenia and thrombocytopenia is due to sequestration not from bone marrow suppression She tolerated reduced dose of prednisone well without significant hyperglycemia Right now, the plan will be for her to continue on current dose of prednisone along with azathioprine  Autoimmune hepatitis (HCC) Her liver enzymes are normal Her total bilirubin is stable She has appointment to see liver specialist at [name] on October 14 I plan to review test results after her visit and will bring her back for further follow-up  No orders of the defined types were placed in this encounter.   The total time spent in the appointment was 25 minutes encounter with patients including review of chart and various tests results, discussions about plan of care and coordination of care plan   All questions were answered. The patient knows to call the clinic with any problems, questions or concerns. No barriers to learning was detected.    Artis Delay, MD 9/19/20241:10 PM  INTERVAL HISTORY: Linda [name] 37 y.o. female returns for further follow-up for history of neutropenic fever in the setting of chronic immunosuppressive therapy for autoimmune hepatitis She is doing well No recent bleeding Denies recent infection We discussed test results and future follow-up  SUMMARY OF HEMATOLOGIC HISTORY:  Please see my consult note dated 05/04/2023 for further details.  The patient was seen due to neutropenic fever Linda [name] 37 y.o. female is admitted overnight for neutropenic fever She has background history of autoimmune hepatitis and liver cirrhosis She has been on high dose Imuran for 2 years Her baseline labs from 2017 and 2022 was normal She has very frequent  blood count monitoring through the Gi clinic, results are reviewed from Care Everywhere. She has chronic pancytopenia for a very long time and has not need transfusions. She never have infections from pancytopenia She bruises easily The patient denies any recent signs or symptoms of bleeding such as spontaneous epistaxis, hematuria or hematochezia. She has no menses for last 7 years due to IUD She is not symptomatic from anemia such as dyspnea, chest pain or presyncopal episodes She drinks rarely Last month, due to worsening pancytopenia, she was directed to ER, not admission Imuran is on hold, resume on 05/18/2023 at half of prior dose of Imuran of 100 mg daily On 05/29/2023, 40 mg of prednisone is added On June 28, 2023, the dose of prednisone is reduced to 10 mg and she continues taking Imuran at 100 mg On June 26, 2023, MRI of the abdomen and showed   IMPRESSION: 1. 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 There is extensive venous collateralization in the left abdomen with paraesophageal varices evident. 2. Marked splenomegaly with estimated splenic volume of 2024 cc.  3. Small and irregular right hepatic lobe with subtle areas of bandlike T2 hyperintensity deep to foci of capsular retraction. These regions show delayed enhancement on postcontrast imaging, consistent with fibrosis. No focal suspicious enhancing mass lesion. 4. Trace perihepatic and perisplenic ascites. 5. Diffuse edema in the mesentery, right greater than left and retroperitoneal space.  I have reviewed the past medical history, past surgical history, social history and family history with the patient and they are unchanged from previous note.  ALLERGIES:  is allergic to other.  MEDICATIONS:  Current Outpatient Medications  Medication Sig Dispense Refill   azaTHIOprine (IMURAN) 50 MG tablet Take 2 tablets (100 mg total) by mouth daily. 60 tablet 3   Insulin Lispro-aabc  (LYUMJEV KWIKPEN) 100 UNIT/ML KwikPen INJECT 1 UNIT UNDER THE SKIN PER 15 GRAMS CARBOHYDRATES PLUS SLIDING SCALE. ADD 1 UNIT PER GLUCOSE 50 ABOVE 150. TOTAL 30 UNITS/DAY.     levonorgestrel (MIRENA, 52 MG,) 20 MCG/24HR IUD Mirena 20 mcg/24 hours (7 yrs) 52 mg intrauterine device  Take by intrauterine route.     predniSONE (DELTASONE) 10 MG tablet Take 2 tablets (20 mg total) by mouth daily with breakfast. (Patient taking differently: Take 10 mg by mouth daily with breakfast. Reduced to 10 mg 06/28/23) 60 tablet 1   TOUJEO SOLOSTAR 300 UNIT/ML Solostar Pen Inject 16 Units into the skin at bedtime.     No current facility-administered medications for this visit.     REVIEW OF SYSTEMS:   Constitutional: Denies fevers, chills or night sweats Eyes: Denies blurriness of vision Ears, nose, mouth, throat, and face: Denies mucositis or sore throat Respiratory: Denies cough, dyspnea or wheezes Cardiovascular: Denies palpitation, chest discomfort or lower extremity swelling Gastrointestinal:  Denies nausea, heartburn or change in bowel habits Skin: Denies abnormal skin rashes Lymphatics: Denies new lymphadenopathy or easy bruising Neurological:Denies numbness, tingling or new weaknesses Behavioral/Psych: Mood is stable, no new changes  All other systems were reviewed with the patient and are negative.  PHYSICAL EXAMINATION: ECOG PERFORMANCE STATUS: 0 - Asymptomatic  Vitals:   08/02/23 1237  BP: (!) 111/59  Pulse: 75  Resp: 18  Temp: 98.7 F (37.1 C)  SpO2: 99%   Filed Weights   08/02/23 1237  Weight: 167 lb 6.4 oz (75.9 kg)    GENERAL:alert, no distress and comfortable   LABORATORY DATA:  I have reviewed the data as listed     Component Value Date/Time   NA 137 08/02/2023 1224   K 4.0 08/02/2023 1224   CL 108 08/02/2023 1224   CO2 25 08/02/2023 1224   GLUCOSE 173 (H) 08/02/2023 1224   BUN 10 08/02/2023 1224   CREATININE 0.57 08/02/2023 1224   CALCIUM 8.9 08/02/2023 1224    PROT 6.8 08/02/2023 1224   ALBUMIN 3.8 08/02/2023 1224   AST 29 08/02/2023 1224   ALT 31 08/02/2023 1224   ALKPHOS 80 08/02/2023 1224   BILITOT 2.4 (H) 08/02/2023 1224   GFRNONAA >60 08/02/2023 1224    No results found for: "SPEP", "UPEP"  Lab Results  Component Value Date   WBC 1.0 (L) 08/02/2023   NEUTROABS PENDING 08/02/2023   HGB 11.0 (L) 08/02/2023   HCT 32.3 (L) 08/02/2023   MCV 99.7 08/02/2023   PLT 46 (L) 08/02/2023      Chemistry      Component Value Date/Time   NA 137 08/02/2023 1224   K 4.0 08/02/2023 1224   CL 108 08/02/2023 1224   CO2 25 08/02/2023 1224   BUN 10 08/02/2023 1224   CREATININE 0.57 08/02/2023 1224      Component Value Date/Time   CALCIUM 8.9 08/02/2023 1224   ALKPHOS 80 08/02/2023 1224   AST 29 08/02/2023 1224   ALT 31 08/02/2023 1224   BILITOT 2.4 (H) 08/02/2023 1224

## 2023-08-02 NOTE — Assessment & Plan Note (Signed)
Her liver enzymes are normal Her total bilirubin is stable She has appointment to see liver specialist at [facility] on October 14 I plan to review test results after her visit and will bring her back for further follow-up

## 2023-08-16 ENCOUNTER — Encounter: Payer: Self-pay | Admitting: Hematology and Oncology

## 2023-08-28 ENCOUNTER — Telehealth: Payer: Self-pay

## 2023-08-28 NOTE — Telephone Encounter (Signed)
Called and given below message. She verbalized understanding and is doing well. She does not need any prescription refills. She is getting liver biopsy next week. FYI.

## 2023-08-28 NOTE — Telephone Encounter (Signed)
-----   Message from Artis Delay sent at 08/28/2023  8:13 AM EDT ----- Pls call her, labs from [facility] were reviewed No change to her treatment, labs are stable I will recommend 4 weeks follow-up with me, I will send LOS But if she has more labs to be done at [facility] before that, we will call her and might delay next visit Let her know and ask if she needs any refills

## 2023-08-29 ENCOUNTER — Telehealth: Payer: Self-pay | Admitting: Hematology and Oncology

## 2023-08-29 NOTE — Telephone Encounter (Signed)
Spoke with patient confirming upcoming appointment  

## 2023-09-13 ENCOUNTER — Other Ambulatory Visit: Payer: Self-pay | Admitting: Hematology and Oncology

## 2023-09-27 ENCOUNTER — Inpatient Hospital Stay: Payer: 59 | Attending: Hematology and Oncology | Admitting: Hematology and Oncology

## 2023-09-27 ENCOUNTER — Encounter: Payer: Self-pay | Admitting: Hematology and Oncology

## 2023-09-27 ENCOUNTER — Inpatient Hospital Stay: Payer: 59

## 2023-09-27 VITALS — BP 104/67 | HR 88 | Temp 98.9°F | Resp 18 | Ht 70.0 in | Wt 168.2 lb

## 2023-09-27 DIAGNOSIS — E109 Type 1 diabetes mellitus without complications: Secondary | ICD-10-CM

## 2023-09-27 DIAGNOSIS — E119 Type 2 diabetes mellitus without complications: Secondary | ICD-10-CM | POA: Diagnosis not present

## 2023-09-27 DIAGNOSIS — K754 Autoimmune hepatitis: Secondary | ICD-10-CM | POA: Diagnosis not present

## 2023-09-27 DIAGNOSIS — D61818 Other pancytopenia: Secondary | ICD-10-CM

## 2023-09-27 DIAGNOSIS — Z794 Long term (current) use of insulin: Secondary | ICD-10-CM | POA: Insufficient documentation

## 2023-09-27 DIAGNOSIS — Z7952 Long term (current) use of systemic steroids: Secondary | ICD-10-CM | POA: Diagnosis not present

## 2023-09-27 LAB — CBC WITH DIFFERENTIAL/PLATELET
Abs Immature Granulocytes: 0 10*3/uL (ref 0.00–0.07)
Basophils Absolute: 0 10*3/uL (ref 0.0–0.1)
Basophils Relative: 1 %
Eosinophils Absolute: 0.2 10*3/uL (ref 0.0–0.5)
Eosinophils Relative: 9 %
HCT: 31.8 % — ABNORMAL LOW (ref 36.0–46.0)
Hemoglobin: 11 g/dL — ABNORMAL LOW (ref 12.0–15.0)
Immature Granulocytes: 0 %
Lymphocytes Relative: 16 %
Lymphs Abs: 0.3 10*3/uL — ABNORMAL LOW (ref 0.7–4.0)
MCH: 35.9 pg — ABNORMAL HIGH (ref 26.0–34.0)
MCHC: 34.6 g/dL (ref 30.0–36.0)
MCV: 103.9 fL — ABNORMAL HIGH (ref 80.0–100.0)
Monocytes Absolute: 0.2 10*3/uL (ref 0.1–1.0)
Monocytes Relative: 11 %
Neutro Abs: 1.1 10*3/uL — ABNORMAL LOW (ref 1.7–7.7)
Neutrophils Relative %: 63 %
Platelets: 52 10*3/uL — ABNORMAL LOW (ref 150–400)
RBC: 3.06 MIL/uL — ABNORMAL LOW (ref 3.87–5.11)
RDW: 15.9 % — ABNORMAL HIGH (ref 11.5–15.5)
WBC: 1.8 10*3/uL — ABNORMAL LOW (ref 4.0–10.5)
nRBC: 0 % (ref 0.0–0.2)

## 2023-09-27 LAB — COMPREHENSIVE METABOLIC PANEL
ALT: 27 U/L (ref 0–44)
AST: 27 U/L (ref 15–41)
Albumin: 3.7 g/dL (ref 3.5–5.0)
Alkaline Phosphatase: 82 U/L (ref 38–126)
Anion gap: 6 (ref 5–15)
BUN: 7 mg/dL (ref 6–20)
CO2: 26 mmol/L (ref 22–32)
Calcium: 8.6 mg/dL — ABNORMAL LOW (ref 8.9–10.3)
Chloride: 103 mmol/L (ref 98–111)
Creatinine, Ser: 0.54 mg/dL (ref 0.44–1.00)
GFR, Estimated: >60 mL/min (ref >60)
Glucose, Bld: 250 mg/dL — ABNORMAL HIGH (ref 70–99)
Potassium: 3.7 mmol/L (ref 3.5–5.1)
Sodium: 135 mmol/L (ref 135–145)
Total Bilirubin: 2.5 mg/dL — ABNORMAL HIGH (ref <1.2)
Total Protein: 6.6 g/dL (ref 6.5–8.1)

## 2023-09-27 NOTE — Assessment & Plan Note (Signed)
I have reviewed her multiple investigation from [facility] at care everywhere She has multiple appointment pending including repeat EGD next month and follow-up in the liver clinic in January with repeat labs I will see her back in February assuming her labs in January and normal However, if she has more frequent follow-up at [facility], we can space out her appointment further She will call me in January for updates

## 2023-09-27 NOTE — Assessment & Plan Note (Signed)
Despite worsening leukopenia and thrombocytopenia, her red blood cell count has improved/stabilized She is asymptomatic I suspect the cause of her leukopenia and thrombocytopenia is due to sequestration not from bone marrow suppression She tolerated reduced dose of prednisone well without significant hyperglycemia Right now, the plan will be for her to continue on current dose of prednisone along with azathioprine

## 2023-09-27 NOTE — Progress Notes (Signed)
[facility] OFFICE PROGRESS NOTE  Linda Canary, PA-C  ASSESSMENT & PLAN:  Autoimmune hepatitis [facility]) I have reviewed her multiple investigation from [facility] at care everywhere She has multiple appointment pending including repeat EGD next month and follow-up in the liver clinic in January with repeat labs I will see her back in February assuming her labs in January and normal However, if she has more frequent follow-up at [facility], we can space out her appointment further She will call me in January for updates  Pancytopenia [facility]) Despite worsening leukopenia and thrombocytopenia, her red blood cell count has improved/stabilized She is asymptomatic I suspect the cause of her leukopenia and thrombocytopenia is due to sequestration not from bone marrow suppression She tolerated reduced dose of prednisone well without significant hyperglycemia Right now, the plan will be for her to continue on current dose of prednisone along with azathioprine  DM (diabetes mellitus) (HCC) She is able to manage her diabetes well with dietary modification and insulin, despite being on prednisone Her diabetes is well controlled  No orders of the defined types were placed in this encounter.   The total time spent in the appointment was 30 minutes encounter with patients including review of chart and various tests results, discussions about plan of care and coordination of care plan   All questions were answered. The patient knows to call the clinic with any problems, questions or concerns. No barriers to learning was detected.    Artis Delay, MD 11/14/20243:03 PM  INTERVAL HISTORY: Linda [name] Lyme 37 y.o. female returns for further follow-up for pancytopenia in the setting of autoimmune hepatitis and chronic immunosuppressive therapy I have reviewed multiple investigations from [facility], everything is summarized in the hematologic history She had EGD as well as liver biopsy She is doing well  overall No recent bleeding or infection She tolerated reduced dose of prednisone well  SUMMARY OF HEMATOLOGIC HISTORY:  Please see my consult note dated 05/04/2023 for further details.  The patient was seen due to neutropenic fever Linda [name] wa s admitted overnight for neutropenic fever She has background history of autoimmune hepatitis and liver cirrhosis She has been on high dose Imuran for 2 years Her baseline labs from 2017 and 2022 was normal She has very frequent blood count monitoring through the Gi clinic, results are reviewed from Care Everywhere. She has chronic pancytopenia for a very long time and has not need transfusions. She never have infections from pancytopenia She bruises easily The patient denies any recent signs or symptoms of bleeding such as spontaneous epistaxis, hematuria or hematochezia. She has no menses for last 7 years due to IUD She is not symptomatic from anemia such as dyspnea, chest pain or presyncopal episodes She drinks rarely Last month, due to worsening pancytopenia, she was directed to ER, not admission Imuran is on hold, resume on 05/18/2023 at half of prior dose of Imuran of 100 mg daily On 05/29/2023, 40 mg of prednisone is added On June 28, 2023, the dose of prednisone is reduced to 10 mg and she continues taking Imuran at 100 mg On June 26, 2023, MRI of the abdomen and showed   IMPRESSION: 1. 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 There is extensive venous collateralization in the left abdomen with paraesophageal varices evident. 2. Marked splenomegaly with estimated splenic volume of 2024 cc.  3. Small and irregular right hepatic lobe with subtle areas of bandlike T2 hyperintensity deep  to foci of capsular retraction. These regions show delayed enhancement on postcontrast imaging, consistent with fibrosis. No focal suspicious enhancing mass lesion. 4. Trace perihepatic and perisplenic  ascites. 5. Diffuse edema in the mesentery, right greater than left and retroperitoneal space.  On September 06, 2023, she had CT imaging at [facility]  IMPRESSION: 1.  No active hemorrhage or perihepatic hematoma identified.  2.  Cirrhosis and portal hypertension with varices and small volume ascites.  I have reviewed the past medical history, past surgical history, social history and family history with the patient and they are unchanged from previous note.  She underwent liver biopsy on September 06, 2023 A. LIVER, LEFT, ULTRASOUND-GUIDED RANDOM NEEDLE CORE BIOPSY: LIVER WITH FOCAL MILD PORTAL CHRONIC INFLAMMATION. SEE COMMENT. NO INTERFACE OR LOBULAR HEPATITIS IS IDENTIFIED. NEGATIVE FOR PATHOLOGIC FIBROSIS.   COMMENT: The history of autoimmune hepatitis status post therapy is noted. No ongoing hepatocellular damage is appreciated in this sample.  On September 24, 2023, she underwent EGD at [facility] Procedure Date: 09/24/2023 9:51 AM Age: 79 Instrument Name: [id number] MRN: [id number] Procedure: Upper GI endoscopy Indications: Portal hypertension rule out esophageal varices; autoimmune hepatitis on AZA Providers: Trinna Balloon, MD Referring MD: Mauri Reading, MD Medicines: Monitored Anesthesia Care Complications: No immediate complications. Procedure: After obtaining informed consent, the endoscope was passed under direct vision. Throughout the procedure, the patient's blood pressure, pulse, and oxygen saturations were monitored continuously. The Flexible  Endoscope was introduced through the mouth, and advanced to the second part of duodenum. The upper GI endoscopy was accomplished without difficulty. The patient tolerated the procedure well. Findings: A small amount of blood was noted in the posterior oropharynx on scope insertion, likely from reported prior nose bleed, with no visualized mucosa trauma in this area. Grade III varices were found in the mid esophagus and in the distal  esophagus. Five bands were successfully placed with complete eradication, resulting in deflation of varices. Portal hypertensive gastropathy was found in the stomach. Diffuse moderate mucosal changes characterized by atrophy, discoloration, erythema, flattening and aphthous ulceration were found in the entire duodenum, question duodenal enteropathy related to AIH(?).  Biopsies were taken with a cold forceps for histology. Estimated Blood Loss: Estimated blood loss was minimal.  Impression: - Small amount of blood in the posterior oropharynx on scope insertion, likely from prior nose bleed and thrombocytopenia (platelest 49K), no visualized mucosal trauma. - Grade III esophageal varices. Completely eradicated. Banded x 5. - Portal hypertensive gastropathy. - Mucosal changes in the duodenum with erosive  changes, atrophy, erythema and flattening. Biopsied.   ALLERGIES:  is allergic to other.  MEDICATIONS:  Current Outpatient Medications  Medication Sig Dispense Refill   predniSONE (DELTASONE) 10 MG tablet Take 5 mg by mouth daily with breakfast.     azaTHIOprine (IMURAN) 50 MG tablet TAKE 2 TABLETS BY MOUTH DAILY 60 tablet 3   Insulin Lispro-aabc (LYUMJEV KWIKPEN) 100 UNIT/ML KwikPen INJECT 1 UNIT UNDER THE SKIN PER 15 GRAMS CARBOHYDRATES PLUS SLIDING SCALE. ADD 1 UNIT PER GLUCOSE 50 ABOVE 150. TOTAL 30 UNITS/DAY.     levonorgestrel (MIRENA, 52 MG,) 20 MCG/24HR IUD Mirena 20 mcg/24 hours (7 yrs) 52 mg intrauterine device  Take by intrauterine route.     TOUJEO SOLOSTAR 300 UNIT/ML Solostar Pen Inject 16 Units into the skin at bedtime.     No current facility-administered medications for this visit.     REVIEW OF SYSTEMS:   Constitutional: Denies fevers, chills or night sweats Eyes: Denies blurriness of vision Ears,  nose, mouth, throat, and face: Denies mucositis or sore throat Respiratory: Denies cough, dyspnea or wheezes Cardiovascular: Denies palpitation, chest discomfort or lower  extremity swelling Gastrointestinal:  Denies nausea, heartburn or change in bowel habits Skin: Denies abnormal skin rashes Lymphatics: Denies new lymphadenopathy or easy bruising Neurological:Denies numbness, tingling or new weaknesses Behavioral/Psych: Mood is stable, no new changes  All other systems were reviewed with the patient and are negative.  PHYSICAL EXAMINATION: ECOG PERFORMANCE STATUS: 0 - Asymptomatic  Vitals:   09/27/23 1217  BP: 104/67  Pulse: 88  Resp: 18  Temp: 98.9 F (37.2 C)  SpO2: 100%   Filed Weights   09/27/23 1217  Weight: 168 lb 3.2 oz (76.3 kg)    GENERAL:alert, no distress and comfortable   LABORATORY DATA:  I have reviewed the data as listed     Component Value Date/Time   NA 135 09/27/2023 1204   K 3.7 09/27/2023 1204   CL 103 09/27/2023 1204   CO2 26 09/27/2023 1204   GLUCOSE 250 (H) 09/27/2023 1204   BUN 7 09/27/2023 1204   CREATININE 0.54 09/27/2023 1204   CALCIUM 8.6 (L) 09/27/2023 1204   PROT 6.6 09/27/2023 1204   ALBUMIN 3.7 09/27/2023 1204   AST 27 09/27/2023 1204   ALT 27 09/27/2023 1204   ALKPHOS 82 09/27/2023 1204   BILITOT 2.5 (H) 09/27/2023 1204   GFRNONAA >60 09/27/2023 1204    No results found for: "SPEP", "UPEP"  Lab Results  Component Value Date   WBC 1.8 (L) 09/27/2023   NEUTROABS 1.1 (L) 09/27/2023   HGB 11.0 (L) 09/27/2023   HCT 31.8 (L) 09/27/2023   MCV 103.9 (H) 09/27/2023   PLT 52 (L) 09/27/2023      Chemistry      Component Value Date/Time   NA 135 09/27/2023 1204   K 3.7 09/27/2023 1204   CL 103 09/27/2023 1204   CO2 26 09/27/2023 1204   BUN 7 09/27/2023 1204   CREATININE 0.54 09/27/2023 1204      Component Value Date/Time   CALCIUM 8.6 (L) 09/27/2023 1204   ALKPHOS 82 09/27/2023 1204   AST 27 09/27/2023 1204   ALT 27 09/27/2023 1204   BILITOT 2.5 (H) 09/27/2023 1204

## 2023-09-27 NOTE — Assessment & Plan Note (Signed)
She is able to manage her diabetes well with dietary modification and insulin, despite being on prednisone Her diabetes is well controlled

## 2023-10-27 ENCOUNTER — Other Ambulatory Visit: Payer: Self-pay | Admitting: Hematology and Oncology

## 2023-12-11 ENCOUNTER — Telehealth: Payer: Self-pay

## 2023-12-11 NOTE — Telephone Encounter (Signed)
She called back and appts scheduled on 2/7. She is aware of appts. She will be out of town the week of 2/10.

## 2023-12-11 NOTE — Telephone Encounter (Signed)
Called and left a message for her to call the office. Dr. Bertis Ruddy would like to see her with labs the week of 2/10. Dr. Bertis Ruddy review out side records at [facility] and they have not made any changes to her medications.

## 2023-12-12 ENCOUNTER — Telehealth: Payer: Self-pay | Admitting: Hematology and Oncology

## 2023-12-12 NOTE — Telephone Encounter (Signed)
Rescheduled appointments per 1/28 MyChart message. Left patient a voicemail with the new date and times. Patient is active on MyChart.

## 2023-12-20 ENCOUNTER — Encounter: Payer: Self-pay | Admitting: Hematology and Oncology

## 2023-12-20 ENCOUNTER — Inpatient Hospital Stay: Payer: 59 | Attending: Hematology and Oncology

## 2023-12-20 ENCOUNTER — Inpatient Hospital Stay: Payer: 59 | Admitting: Hematology and Oncology

## 2023-12-20 VITALS — BP 116/65 | HR 89 | Temp 99.4°F | Resp 18 | Ht 70.0 in | Wt 165.2 lb

## 2023-12-20 DIAGNOSIS — K754 Autoimmune hepatitis: Secondary | ICD-10-CM | POA: Insufficient documentation

## 2023-12-20 DIAGNOSIS — D61818 Other pancytopenia: Secondary | ICD-10-CM

## 2023-12-20 LAB — CBC WITH DIFFERENTIAL/PLATELET
Abs Immature Granulocytes: 0 10*3/uL (ref 0.00–0.07)
Basophils Absolute: 0 10*3/uL (ref 0.0–0.1)
Basophils Relative: 1 %
Eosinophils Absolute: 0.2 10*3/uL (ref 0.0–0.5)
Eosinophils Relative: 11 %
HCT: 35.3 % — ABNORMAL LOW (ref 36.0–46.0)
Hemoglobin: 12.2 g/dL (ref 12.0–15.0)
Immature Granulocytes: 0 %
Lymphocytes Relative: 22 %
Lymphs Abs: 0.3 10*3/uL — ABNORMAL LOW (ref 0.7–4.0)
MCH: 37 pg — ABNORMAL HIGH (ref 26.0–34.0)
MCHC: 34.6 g/dL (ref 30.0–36.0)
MCV: 107 fL — ABNORMAL HIGH (ref 80.0–100.0)
Monocytes Absolute: 0.1 10*3/uL (ref 0.1–1.0)
Monocytes Relative: 10 %
Neutro Abs: 0.8 10*3/uL — ABNORMAL LOW (ref 1.7–7.7)
Neutrophils Relative %: 56 %
Platelets: 69 10*3/uL — ABNORMAL LOW (ref 150–400)
RBC: 3.3 MIL/uL — ABNORMAL LOW (ref 3.87–5.11)
RDW: 15 % (ref 11.5–15.5)
WBC: 1.4 10*3/uL — ABNORMAL LOW (ref 4.0–10.5)
nRBC: 0 % (ref 0.0–0.2)

## 2023-12-20 LAB — COMPREHENSIVE METABOLIC PANEL
ALT: 37 U/L (ref 0–44)
AST: 33 U/L (ref 15–41)
Albumin: 4.1 g/dL (ref 3.5–5.0)
Alkaline Phosphatase: 77 U/L (ref 38–126)
Anion gap: 4 — ABNORMAL LOW (ref 5–15)
BUN: 14 mg/dL (ref 6–20)
CO2: 28 mmol/L (ref 22–32)
Calcium: 9.2 mg/dL (ref 8.9–10.3)
Chloride: 105 mmol/L (ref 98–111)
Creatinine, Ser: 0.58 mg/dL (ref 0.44–1.00)
GFR, Estimated: >60 mL/min (ref >60)
Glucose, Bld: 174 mg/dL — ABNORMAL HIGH (ref 70–99)
Potassium: 4.3 mmol/L (ref 3.5–5.1)
Sodium: 137 mmol/L (ref 135–145)
Total Bilirubin: 2.2 mg/dL — ABNORMAL HIGH (ref 0.0–1.2)
Total Protein: 7.2 g/dL (ref 6.5–8.1)

## 2023-12-20 MED ORDER — PREDNISONE 5 MG PO TABS: 5.0000 mg | ORAL_TABLET | Freq: Every day | ORAL | 1 refills | Status: DC

## 2023-12-20 MED ORDER — AZATHIOPRINE 50 MG PO TABS: 100.0000 mg | ORAL_TABLET | Freq: Every day | ORAL | 1 refills | Status: DC

## 2023-12-20 NOTE — Assessment & Plan Note (Signed)
 I have reviewed her multiple investigation from [facility] at care everywhere I reviewed multiple blood work, imaging study and EGD results She will continue follow-up at [facility]

## 2023-12-20 NOTE — Progress Notes (Signed)
 [facility] OFFICE PROGRESS NOTE  Linda Joesph DEL, PA-C  ASSESSMENT & PLAN:  Pancytopenia (HCC) Despite worsening leukopenia and thrombocytopenia, her red blood cell count has improved/stabilized She is asymptomatic I suspect the cause of her leukopenia and thrombocytopenia is due to sequestration from splenomegaly and not from bone marrow suppression She tolerated reduced dose of prednisone  well without significant hyperglycemia Right now, the plan will be for her to continue on current dose of prednisone  along with azathioprine  She is currently on current stable dose for 6 months Plan to see her every 3 months  Autoimmune hepatitis (HCC) I have reviewed her multiple investigation from [facility] at care everywhere I reviewed multiple blood work, imaging study and EGD results She will continue follow-up at [facility]  No orders of the defined types were placed in this encounter.   The total time spent in the appointment was 20 minutes encounter with patients including review of chart and various tests results, discussions about plan of care and coordination of care plan   All questions were answered. The patient knows to call the clinic with any problems, questions or concerns. No barriers to learning was detected.    Linda Bedford, MD 2/6/202512:43 PM  INTERVAL HISTORY: Linda [name] 38 y.o. female returns for further follow-up for history of febrile neutropenia in the setting of chronic pancytopenia due to autoimmune hepatitis and chronic immunosuppressive therapy Since she was last referred to [facility], she has been feeling well She had multiple EGD procedures and significant evaluation She denies recent bleeding She tolerated current dose of prednisone  and azathioprine  well  SUMMARY OF HEMATOLOGIC HISTORY:  Please see my consult note dated 05/04/2023 for further details.  The patient was seen due to neutropenic fever Linda [name] admitted overnight for neutropenic  fever She has background history of autoimmune hepatitis and liver cirrhosis She has been on high dose Imuran  for 2 years Her baseline labs from 2017 and 2022 was normal She has very frequent blood count monitoring through the Gi clinic, results are reviewed from Care Everywhere. She has chronic pancytopenia for a very long time and has not need transfusions. She never have infections from pancytopenia She bruises easily The patient denies any recent signs or symptoms of bleeding such as spontaneous epistaxis, hematuria or hematochezia. She has no menses for last 7 years due to IUD She is not symptomatic from anemia such as dyspnea, chest pain or presyncopal episodes She drinks rarely Last month, due to worsening pancytopenia, she was directed to ER, not admission Imuran  is on hold, resume on 05/18/2023 at half of prior dose of Imuran  of 100 mg daily On 05/29/2023, 40 mg of prednisone  is added On June 28, 2023, the dose of prednisone  is reduced to 10 mg and she continues taking Imuran  at 100 mg On June 26, 2023, MRI of the abdomen and showed   IMPRESSION: 1. 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 There is extensive venous collateralization in the left abdomen with paraesophageal varices evident. 2. Marked splenomegaly with estimated splenic volume of 2024 cc.  3. Small and irregular right hepatic lobe with subtle areas of bandlike T2 hyperintensity deep to foci of capsular retraction. These regions show delayed enhancement on postcontrast imaging, consistent with fibrosis. No focal suspicious enhancing mass lesion. 4. Trace perihepatic and perisplenic ascites. 5. Diffuse edema in the mesentery, right greater than left and retroperitoneal space.  On September 06, 2023, she had CT  imaging at [facility]  IMPRESSION: 1.  No active hemorrhage or perihepatic hematoma identified.  2.  Cirrhosis and portal hypertension with varices and small volume  ascites.  I have reviewed the past medical history, past surgical history, social history and family history with the patient and they are unchanged from previous note.  She underwent liver biopsy on September 06, 2023 A. LIVER, LEFT, ULTRASOUND-GUIDED RANDOM NEEDLE CORE BIOPSY: LIVER WITH FOCAL MILD PORTAL CHRONIC INFLAMMATION. SEE COMMENT. NO INTERFACE OR LOBULAR HEPATITIS IS IDENTIFIED. NEGATIVE FOR PATHOLOGIC FIBROSIS.   COMMENT: The history of autoimmune hepatitis status post therapy is noted. No ongoing hepatocellular damage is appreciated in this sample.  On September 24, 2023, she underwent EGD at [facility] Procedure Date: 09/24/2023 9:51 AM Age: 8 Instrument Name: [id number] MRN: [id number] Procedure: Upper GI endoscopy Indications: Portal hypertension rule out esophageal varices; autoimmune hepatitis on AZA Providers: Linda Deitra Crock, MD Referring MD: Linda New, MD Medicines: Monitored Anesthesia Care Complications: No immediate complications. Procedure: After obtaining informed consent, the endoscope was passed under direct vision. Throughout the procedure, the patient'[name] blood pressure, pulse, and oxygen saturations were monitored continuously. The Flexible  Endoscope was introduced through the mouth, and advanced to the second part of duodenum. The upper GI endoscopy was accomplished without difficulty. The patient tolerated the procedure well. Findings: A small amount of blood was noted in the posterior oropharynx on scope insertion, likely from reported prior nose bleed, with no visualized mucosa trauma in this area. Grade III varices were found in the mid esophagus and in the distal esophagus. Five bands were successfully placed with complete eradication, resulting in deflation of varices. Portal hypertensive gastropathy was found in the stomach. Diffuse moderate mucosal changes characterized by atrophy, discoloration, erythema, flattening and aphthous ulceration were found  in the entire duodenum, question duodenal enteropathy related to AIH(?).  Biopsies were taken with a cold forceps for histology. Estimated Blood Loss: Estimated blood loss was minimal.  Impression: - Small amount of blood in the posterior oropharynx on scope insertion, likely from prior nose bleed and thrombocytopenia (platelest 49K), no visualized mucosal trauma. - Grade III esophageal varices. Completely eradicated. Banded x 5. - Portal hypertensive gastropathy. - Mucosal changes in the duodenum with erosive  changes, atrophy, erythema and flattening. Biopsied. In November 2024, her prednisone  dose is reduced to 5 mg daily  I have reviewed the past medical history, past surgical history, social history and family history with the patient and they are unchanged from previous note.  ALLERGIES:  is allergic to other.  MEDICATIONS:  Current Outpatient Medications  Medication Sig Dispense Refill   predniSONE  (DELTASONE ) 5 MG tablet Take 1 tablet (5 mg total) by mouth daily with breakfast. 90 tablet 1   azaTHIOprine  (IMURAN ) 50 MG tablet Take 2 tablets (100 mg total) by mouth daily. 180 tablet 1   Insulin  Lispro-aabc (LYUMJEV KWIKPEN) 100 UNIT/ML KwikPen INJECT 1 UNIT UNDER THE SKIN PER 15 GRAMS CARBOHYDRATES PLUS SLIDING SCALE. ADD 1 UNIT PER GLUCOSE 50 ABOVE 150. TOTAL 30 UNITS/DAY.     levonorgestrel  (MIRENA , 52 MG,) 20 MCG/24HR IUD Mirena  20 mcg/24 hours (7 yrs) 52 mg intrauterine device  Take by intrauterine route.     TOUJEO  SOLOSTAR 300 UNIT/ML Solostar Pen Inject 18 Units into the skin at bedtime.     No current facility-administered medications for this visit.     REVIEW OF SYSTEMS:   Constitutional: Denies fevers, chills or night sweats Eyes: Denies blurriness of vision Ears, nose, mouth, throat, and face:  Denies mucositis or sore throat Respiratory: Denies cough, dyspnea or wheezes Cardiovascular: Denies palpitation, chest discomfort or lower extremity  swelling Gastrointestinal:  Denies nausea, heartburn or change in bowel habits Skin: Denies abnormal skin rashes Lymphatics: Denies [name] lymphadenopathy or easy bruising Neurological:Denies numbness, tingling or [name] weaknesses Behavioral/Psych: Mood is stable, no [name] changes  All other systems were reviewed with the patient and are negative.  PHYSICAL EXAMINATION: ECOG PERFORMANCE STATUS: 0 - Asymptomatic  Vitals:   12/20/23 1145  BP: 116/65  Pulse: 89  Resp: 18  Temp: 99.4 F (37.4 C)  SpO2: 97%   Filed Weights   12/20/23 1145  Weight: 165 lb 3.2 oz (74.9 kg)    GENERAL:alert, no distress and comfortable LABORATORY DATA:  I have reviewed the data as listed     Component Value Date/Time   NA 137 12/20/2023 1121   K 4.3 12/20/2023 1121   CL 105 12/20/2023 1121   CO2 28 12/20/2023 1121   GLUCOSE 174 (H) 12/20/2023 1121   BUN 14 12/20/2023 1121   CREATININE 0.58 12/20/2023 1121   CALCIUM 9.2 12/20/2023 1121   PROT 7.2 12/20/2023 1121   ALBUMIN 4.1 12/20/2023 1121   AST 33 12/20/2023 1121   ALT 37 12/20/2023 1121   ALKPHOS 77 12/20/2023 1121   BILITOT 2.2 (H) 12/20/2023 1121   GFRNONAA >60 12/20/2023 1121    No results found for: SPEP, UPEP  Lab Results  Component Value Date   WBC 1.4 (L) 12/20/2023   NEUTROABS 0.8 (L) 12/20/2023   HGB 12.2 12/20/2023   HCT 35.3 (L) 12/20/2023   MCV 107.0 (H) 12/20/2023   PLT 69 (L) 12/20/2023      Chemistry      Component Value Date/Time   NA 137 12/20/2023 1121   K 4.3 12/20/2023 1121   CL 105 12/20/2023 1121   CO2 28 12/20/2023 1121   BUN 14 12/20/2023 1121   CREATININE 0.58 12/20/2023 1121      Component Value Date/Time   CALCIUM 9.2 12/20/2023 1121   ALKPHOS 77 12/20/2023 1121   AST 33 12/20/2023 1121   ALT 37 12/20/2023 1121   BILITOT 2.2 (H) 12/20/2023 1121

## 2023-12-20 NOTE — Assessment & Plan Note (Signed)
 Despite worsening leukopenia and thrombocytopenia, her red blood cell count has improved/stabilized She is asymptomatic I suspect the cause of her leukopenia and thrombocytopenia is due to sequestration from splenomegaly and not from bone marrow suppression She tolerated reduced dose of prednisone  well without significant hyperglycemia Right now, the plan will be for her to continue on current dose of prednisone  along with azathioprine  She is currently on current stable dose for 6 months Plan to see her every 3 months

## 2023-12-21 ENCOUNTER — Ambulatory Visit: Payer: 59 | Admitting: Hematology and Oncology

## 2023-12-21 ENCOUNTER — Other Ambulatory Visit: Payer: 59

## 2024-01-09 ENCOUNTER — Other Ambulatory Visit: Payer: Self-pay | Admitting: Hematology and Oncology

## 2024-03-18 ENCOUNTER — Inpatient Hospital Stay (HOSPITAL_BASED_OUTPATIENT_CLINIC_OR_DEPARTMENT_OTHER): Payer: 59 | Admitting: Hematology and Oncology

## 2024-03-18 ENCOUNTER — Inpatient Hospital Stay: Payer: 59 | Attending: Hematology and Oncology

## 2024-03-18 ENCOUNTER — Encounter: Payer: Self-pay | Admitting: Hematology and Oncology

## 2024-03-18 VITALS — BP 108/64 | HR 75 | Temp 98.8°F | Resp 18 | Ht 70.0 in | Wt 168.8 lb

## 2024-03-18 DIAGNOSIS — D61818 Other pancytopenia: Secondary | ICD-10-CM | POA: Insufficient documentation

## 2024-03-18 DIAGNOSIS — K754 Autoimmune hepatitis: Secondary | ICD-10-CM

## 2024-03-18 LAB — COMPREHENSIVE METABOLIC PANEL WITH GFR
ALT: 41 U/L (ref 0–44)
AST: 43 U/L — ABNORMAL HIGH (ref 15–41)
Albumin: 4 g/dL (ref 3.5–5.0)
Alkaline Phosphatase: 78 U/L (ref 38–126)
Anion gap: 4 — ABNORMAL LOW (ref 5–15)
BUN: 13 mg/dL (ref 6–20)
CO2: 27 mmol/L (ref 22–32)
Calcium: 8.6 mg/dL — ABNORMAL LOW (ref 8.9–10.3)
Chloride: 107 mmol/L (ref 98–111)
Creatinine, Ser: 0.47 mg/dL (ref 0.44–1.00)
GFR, Estimated: >60 mL/min (ref >60)
Glucose, Bld: 155 mg/dL — ABNORMAL HIGH (ref 70–99)
Potassium: 3.9 mmol/L (ref 3.5–5.1)
Sodium: 138 mmol/L (ref 135–145)
Total Bilirubin: 3.9 mg/dL (ref 0.0–1.2)
Total Protein: 6.7 g/dL (ref 6.5–8.1)

## 2024-03-18 LAB — CBC WITH DIFFERENTIAL/PLATELET
Abs Immature Granulocytes: 0.01 10*3/uL (ref 0.00–0.07)
Basophils Absolute: 0 10*3/uL (ref 0.0–0.1)
Basophils Relative: 1 %
Eosinophils Absolute: 0.2 10*3/uL (ref 0.0–0.5)
Eosinophils Relative: 10 %
HCT: 33.2 % — ABNORMAL LOW (ref 36.0–46.0)
Hemoglobin: 12 g/dL (ref 12.0–15.0)
Immature Granulocytes: 1 %
Lymphocytes Relative: 19 %
Lymphs Abs: 0.3 10*3/uL — ABNORMAL LOW (ref 0.7–4.0)
MCH: 39 pg — ABNORMAL HIGH (ref 26.0–34.0)
MCHC: 36.1 g/dL — ABNORMAL HIGH (ref 30.0–36.0)
MCV: 107.8 fL — ABNORMAL HIGH (ref 80.0–100.0)
Monocytes Absolute: 0.1 10*3/uL (ref 0.1–1.0)
Monocytes Relative: 9 %
Neutro Abs: 0.9 10*3/uL — ABNORMAL LOW (ref 1.7–7.7)
Neutrophils Relative %: 60 %
Platelets: 57 10*3/uL — ABNORMAL LOW (ref 150–400)
RBC: 3.08 MIL/uL — ABNORMAL LOW (ref 3.87–5.11)
RDW: 16.3 % — ABNORMAL HIGH (ref 11.5–15.5)
WBC: 1.6 10*3/uL — ABNORMAL LOW (ref 4.0–10.5)
nRBC: 0 % (ref 0.0–0.2)

## 2024-03-18 NOTE — Assessment & Plan Note (Addendum)
 I have reviewed her multiple investigation from [facility] at care everywhere I reviewed multiple blood work, imaging study and EGD results She will continue follow-up at [facility]

## 2024-03-18 NOTE — Assessment & Plan Note (Addendum)
 She has intermittent fluctuation of leukopenia and thrombocytopenia,  Her red blood cell count is stable and she is not anemic  she is asymptomatic I suspect the cause of her leukopenia and thrombocytopenia is due to sequestration from splenomegaly and not from bone marrow suppression She tolerated reduced dose of prednisone  well without significant hyperglycemia Right now, the plan will be for her to continue on current dose of prednisone  along with azathioprine  She is currently on current stable dose for more than 6 months She has appointment to see her gastroenterologist in around August or September Plan to see her back at the end of the year for further follow-up

## 2024-03-18 NOTE — Progress Notes (Signed)
.  CRITICAL VALUE STICKER  CRITICAL VALUE: Total Bilirubin 3.9  RECEIVER (on-site recipient of call):  Jurline Olmsted, RN  DATE & TIME NOTIFIED: [phone] 03/18/24  MESSENGER (representative from lab): Hubbard Mad  MD NOTIFIED: Dr. Marton Sleeper  TIME OF NOTIFICATION: 1243 03/18/24  RESPONSE: Will review

## 2024-03-18 NOTE — Progress Notes (Signed)
 [facility] OFFICE PROGRESS NOTE  Patient Care Team: Mendel Stain as PCP - General (Physician Assistant)  Assessment & Plan Pancytopenia [facility]) She has intermittent fluctuation of leukopenia and thrombocytopenia,  Her red blood cell count is stable and she is not anemic  she is asymptomatic I suspect the cause of her leukopenia and thrombocytopenia is due to sequestration from splenomegaly and not from bone marrow suppression She tolerated reduced dose of prednisone  well without significant hyperglycemia Right now, the plan will be for her to continue on current dose of prednisone  along with azathioprine  She is currently on current stable dose for more than 6 months She has appointment to see her gastroenterologist in around August or September Plan to see her back at the end of the year for further follow-up Autoimmune hepatitis [facility]) I have reviewed her multiple investigation from [facility] at care everywhere I reviewed multiple blood work, imaging study and EGD results She will continue follow-up at [facility]  No orders of the defined types were placed in this encounter.    Almeda Jacobs, MD  INTERVAL HISTORY: she returns for surveillance follow-up on chronic immunosuppressive therapy for autoimmune hepatitis and pancytopenia She is doing well No recent infection The patient denies any recent signs or symptoms of bleeding such as spontaneous epistaxis, hematuria or hematochezia. She had multiple EGD/banding procedure for varices at [facility]  PHYSICAL EXAMINATION: ECOG PERFORMANCE STATUS: 1 - Symptomatic but completely ambulatory  Vitals:   03/18/24 1202  BP: 108/64  Pulse: 75  Resp: 18  Temp: 98.8 F (37.1 C)  SpO2: 100%   Filed Weights   03/18/24 1202  Weight: 168 lb 12.8 oz (76.6 kg)    Relevant data reviewed during this visit included CBC and CMP

## 2024-05-02 ENCOUNTER — Other Ambulatory Visit: Payer: Self-pay | Admitting: Hematology and Oncology

## 2024-06-10 ENCOUNTER — Other Ambulatory Visit: Payer: Self-pay | Admitting: Hematology and Oncology

## 2024-07-09 ENCOUNTER — Other Ambulatory Visit: Payer: Self-pay | Admitting: Hematology and Oncology

## 2024-08-21 ENCOUNTER — Ambulatory Visit: Admitting: Physician Assistant

## 2024-08-21 ENCOUNTER — Encounter: Payer: Self-pay | Admitting: Physician Assistant

## 2024-08-21 VITALS — BP 116/64 | HR 77 | Wt 171.0 lb

## 2024-08-21 DIAGNOSIS — E109 Type 1 diabetes mellitus without complications: Secondary | ICD-10-CM

## 2024-08-21 DIAGNOSIS — Z23 Encounter for immunization: Secondary | ICD-10-CM

## 2024-08-21 DIAGNOSIS — Z0000 Encounter for general adult medical examination without abnormal findings: Secondary | ICD-10-CM

## 2024-08-21 NOTE — Patient Instructions (Addendum)
 VISIT SUMMARY:  You came in today for your annual wellness exam. We discussed your blood pressure, flu shot, type 1 diabetes, autoimmune hepatitis, and mild congestion.  YOUR PLAN:  -ADULT WELLNESS VISIT: Your blood pressure is well-controlled. We discussed your mild congestion, which is likely due to seasonal allergies. You received your flu shot today. I recommend trying Zyrtec or Claritin for your congestion, and you might also find relief using a neti pot.  -TYPE 1 DIABETES MELLITUS: Your type 1 diabetes is well-managed with your current insulin  regimen, and your recent hemoglobin A1c level is excellent at 5.6%. Continue with your regular insulin  administration and follow up with your specialist as needed.  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 Continue with your current treatment plan and follow up with your specialist as needed.  Health Maintenance, Female Adopting a healthy lifestyle and getting preventive care are important in promoting health and wellness. Ask your health care provider about: The right schedule for you to have regular tests and exams. Things you can do on your own to prevent diseases and keep yourself healthy. What should I know about diet, weight, and exercise? Eat a healthy diet  Eat a diet that includes plenty of vegetables, fruits, low-fat dairy products, and lean protein. Do not eat a lot of foods that are high in solid fats, added sugars, or sodium. Maintain a healthy weight Body mass index (BMI) is used to identify weight problems. It estimates body fat based on height and weight. Your health care provider can help determine your BMI and help you achieve or maintain a healthy weight. Get regular exercise Get regular exercise. This is one of the most important things you can do for your health. Most adults should: Exercise for at least 150 minutes each week. The  exercise should increase your heart rate and make you sweat (moderate-intensity exercise). Do strengthening exercises at least twice a week. This is in addition to the moderate-intensity exercise. Spend less time sitting. Even light physical activity can be beneficial. Watch cholesterol and blood lipids Have your blood tested for lipids and cholesterol at 36 years of age, then have this test every 5 years. Have your cholesterol levels checked more often if: Your lipid or cholesterol levels are high. You are older than 36 years of age. You are at high risk for heart disease. What should I know about cancer screening? Depending on your health history and family history, you may need to have cancer screening at various ages. This may include screening for: Breast cancer. Cervical cancer. Colorectal cancer. Skin cancer. Lung cancer. What should I know about heart disease, diabetes, and high blood pressure? Blood pressure and heart disease High blood pressure causes heart disease and increases the risk of stroke. This is more likely to develop in people who have high blood pressure readings or are overweight. Have your blood pressure checked: Every 3-5 years if you are 51-11 years of age. Every year if you are 76 years old or older. Diabetes Have regular diabetes screenings. This checks your fasting blood sugar level. Have the screening done: Once every three years after age 59 if you are at a normal weight and have a low risk for diabetes. More often and at a younger age if you are overweight or have a high risk for diabetes. What should I know about preventing infection? Hepatitis B If you have a higher risk for hepatitis  B, you should be screened for this virus. Talk with your health care provider to find out if you are at risk for hepatitis B infection. Hepatitis C Testing is recommended for: Everyone born from [birth year] through 1963. Anyone with known risk factors for hepatitis  C. Sexually transmitted infections (STIs) Get screened for STIs, including gonorrhea and chlamydia, if: You are sexually active and are younger than 36 years of age. You are older than 36 years of age and your health care provider tells you that you are at risk for this type of infection. Your sexual activity has changed since you were last screened, and you are at increased risk for chlamydia or gonorrhea. Ask your health care provider if you are at risk. Ask your health care provider about whether you are at high risk for HIV. Your health care provider may recommend a prescription medicine to help prevent HIV infection. If you choose to take medicine to prevent HIV, you should first get tested for HIV. You should then be tested every 3 months for as long as you are taking the medicine. Pregnancy If you are about to stop having your period (premenopausal) and you may become pregnant, seek counseling before you get pregnant. Take 400 to 800 micrograms (mcg) of folic acid every day if you become pregnant. Ask for birth control (contraception) if you want to prevent pregnancy. Osteoporosis and menopause Osteoporosis is a disease in which the bones lose minerals and strength with aging. This can result in bone fractures. If you are 31 years old or older, or if you are at risk for osteoporosis and fractures, ask your health care provider if you should: Be screened for bone loss. Take a calcium or vitamin D supplement to lower your risk of fractures. Be given hormone replacement therapy (HRT) to treat symptoms of menopause. Follow these instructions at home: Alcohol use Do not drink alcohol if: Your health care provider tells you not to drink. You are pregnant, may be pregnant, or are planning to become pregnant. If you drink alcohol: Limit how much you have to: 0-1 drink a day. Know how much alcohol is in your drink. In the [place] one drink equals one 12 oz bottle of beer (355 mL), one 5 oz glass  of wine (148 mL), or one 1 oz glass of hard liquor (44 mL). Lifestyle Do not use any products that contain nicotine or tobacco. These products include cigarettes, chewing tobacco, and vaping devices, such as e-cigarettes. If you need help quitting, ask your health care provider. Do not use street drugs. Do not share needles. Ask your health care provider for help if you need support or information about quitting drugs. General instructions Schedule regular health, dental, and eye exams. Stay current with your vaccines. Tell your health care provider if: You often feel depressed. You have ever been abused or do not feel safe at home. Summary Adopting a healthy lifestyle and getting preventive care are important in promoting health and wellness. Follow your health care provider's instructions about healthy diet, exercising, and getting tested or screened for diseases. Follow your health care provider's instructions on monitoring your cholesterol and blood pressure. This information is not intended to replace advice given to you by your health care provider. Make sure you discuss any questions you have with your health care provider. Document Revised: 03/21/2021 Document Reviewed: 03/21/2021 Elsevier Patient Education  2024 [employer].

## 2024-08-21 NOTE — Progress Notes (Unsigned)
 New Patient Office Visit  Subjective    Patient ID: Linda [name], female    DOB: 02-08-1986  Age: 38 y.o. MRN: [id number]  CC:  Chief Complaint  Patient presents with   Annual Exam   Discussed the use of AI scribe software for clinical note transcription with the patient, who gave verbal consent to proceed.  History of Present Illness   Linda [name] is a 38 year old female who presents for an annual wellness exam.  She is concerned about her blood pressure and is due for a flu shot. Her type 1 diabetes is managed with multiple daily insulin  injections, and her most recent hemoglobin A1c is 5.6%.    No other concerns today    Outpatient Encounter Medications as of 08/21/2024  Medication Sig   azaTHIOprine  (IMURAN ) 50 MG tablet TAKE 2 TABLETS BY MOUTH DAILY   Insulin  Lispro-aabc (LYUMJEV KWIKPEN) 100 UNIT/ML KwikPen INJECT 1 UNIT UNDER THE SKIN PER 15 GRAMS CARBOHYDRATES PLUS SLIDING SCALE. ADD 1 UNIT PER GLUCOSE 50 ABOVE 150. TOTAL 30 UNITS/DAY.   levonorgestrel  (MIRENA , 52 MG,) 20 MCG/24HR IUD Mirena  20 mcg/24 hours (7 yrs) 52 mg intrauterine device  Take by intrauterine route.   predniSONE  (DELTASONE ) 5 MG tablet TAKE 1 TABLET BY MOUTH DAILY  WITH BREAKFAST   sulfamethoxazole-trimethoprim  (BACTRIM DS) 800-160 MG tablet Take 1 tablet by mouth 3 (three) times a week.   TOUJEO  SOLOSTAR 300 UNIT/ML Solostar Pen Inject 18 Units into the skin at bedtime.   No facility-administered encounter medications on file as of 08/21/2024.    Past Medical History:  Diagnosis Date   Diabetes mellitus without complication (HCC)    Hepatitis     History reviewed. No pertinent surgical history.  Family History  Problem Relation Age of Onset   Diabetes Sister     Social History   Socioeconomic History   Marital status: Single    Spouse name: Not on file   Number of children: Not on file   Years of education: Not on file   Highest education level: Not on file  Occupational History    Not on file  Tobacco Use   Smoking status: Never   Smokeless tobacco: Never  Vaping Use   Vaping status: Never Used  Substance and Sexual Activity   Alcohol use: Yes    Alcohol/week: 0.0 standard drinks of alcohol   Drug use: No   Sexual activity: Not on file  Other Topics Concern   Not on file  Social History Narrative   Not on file   Social Drivers of Health   Financial Resource Strain: Low Risk  (06/24/2024)   Received from [facility]   Overall Financial Resource Strain (CARDIA)    How hard is it for you to pay for the very basics like food, housing, medical care, and heating?: Not hard at all  Food Insecurity: No Food Insecurity (06/24/2024)   Received from [facility]   Hunger Vital Sign    Within the past 12 months, you worried that your food would run out before you got the money to buy more.: Never true    Within the past 12 months, the food you bought just didn't last and you didn't have money to get more.: Never true  Transportation Needs: No Transportation Needs (06/24/2024)   Received from [facility] - Transportation    In the past 12 months, has lack of transportation kept you from medical appointments or from getting  medications?: No    In the past 12 months, has lack of transportation kept you from meetings, work, or from getting things needed for daily living?: No  Physical Activity: Insufficiently Active (06/24/2024)   Received from [facility]   Exercise Vital Sign    On average, how many days per week do you engage in moderate to strenuous exercise (like a brisk walk)?: 3 days    On average, how many minutes do you engage in exercise at this level?: 30 min  Stress: Stress Concern Present (06/24/2024)   Received from [facility] - Occupational Stress Questionnaire    Do you feel stress - tense, restless, nervous, or anxious, or unable to sleep at night because your mind is troubled all the time -  these days?: Rather much  Social Connections: Socially Integrated (06/24/2024)   Received from [facility]   Social Network    How would you rate your social network (family, work, friends)?: Good participation with social networks  Intimate Partner Violence: Not At Risk (06/24/2024)   Received from [facility]   HITS    Over the last 12 months how often did your partner physically hurt you?: Never    Over the last 12 months how often did your partner insult you or talk down to you?: Never    Over the last 12 months how often did your partner threaten you with physical harm?: Never    Over the last 12 months how often did your partner scream or curse at you?: Never    Review of Systems  Constitutional: Negative.   HENT: Negative.    Eyes: Negative.   Respiratory:  Negative for shortness of breath.   Cardiovascular:  Negative for chest pain.  Gastrointestinal: Negative.   Genitourinary: Negative.   Musculoskeletal: Negative.   Skin: Negative.   Neurological: Negative.   Endo/Heme/Allergies: Negative.   Psychiatric/Behavioral: Negative.          Objective    BP 116/64 (BP Location: Right Arm, Patient Position: Sitting, Cuff Size: Normal)   Pulse 77   Wt 171 lb (77.6 kg)   SpO2 96%   BMI 24.54 kg/m   Physical Exam Vitals and nursing note reviewed.  Constitutional:      Appearance: Normal appearance.  HENT:     Head: Normocephalic and atraumatic.     Right Ear: External ear normal.     Left Ear: External ear normal.     Nose: Nose normal.     Mouth/Throat:     Mouth: Mucous membranes are moist.     Pharynx: Oropharynx is clear.  Eyes:     Extraocular Movements: Extraocular movements intact.     Conjunctiva/sclera: Conjunctivae normal.     Pupils: Pupils are equal, round, and reactive to light.  Cardiovascular:     Rate and Rhythm: Normal rate and regular rhythm.     Pulses: Normal pulses.     Heart sounds: Normal heart sounds.  Pulmonary:     Effort:  Pulmonary effort is normal.     Breath sounds: Normal breath sounds.  Musculoskeletal:        General: Normal range of motion.     Cervical back: Normal range of motion and neck supple.  Skin:    General: Skin is warm and dry.  Neurological:     General: No focal deficit present.     Mental Status: She is alert and oriented to person, place, and time.  Psychiatric:        Mood and Affect: Mood normal.        Behavior: Behavior normal.        Thought Content: Thought content normal.        Judgment: Judgment normal.         Assessment & Plan:   Problem List Items Addressed This Visit       Endocrine   DM (diabetes mellitus) (HCC) (Chronic)   Other Visit Diagnoses       Wellness examination    -  Primary     Need for immunization against influenza       Relevant Orders   Flu vaccine trivalent PF, 6mos and older(Flulaval,Afluria,Fluarix,Fluzone) (Completed)      1. Wellness examination (Primary) Continue follow-up with primary care provider and specialists as needed.  Patient education given on general health maintenance  2. Type 1 diabetes mellitus without complications (HCC) Very well-controlled  3. Need for immunization against influenza  - Flu vaccine trivalent PF, 6mos and older(Flulaval,Afluria,Fluarix,Fluzone)   I have reviewed the patient's medical history (PMH, PSH, Social History, Family History, Medications, and allergies) , and have been updated if relevant. I spent 20 minutes reviewing chart and  face to face time with patient.    Return if symptoms worsen or fail to improve.   Kirk RAMAN Mayers, PA-C

## 2024-10-17 ENCOUNTER — Encounter: Payer: Self-pay | Admitting: Hematology and Oncology

## 2024-10-17 ENCOUNTER — Inpatient Hospital Stay

## 2024-10-17 ENCOUNTER — Inpatient Hospital Stay: Attending: Hematology and Oncology | Admitting: Hematology and Oncology

## 2024-10-17 VITALS — BP 116/71 | HR 80 | Temp 99.3°F | Resp 18 | Ht 70.0 in | Wt 179.8 lb

## 2024-10-17 DIAGNOSIS — Z79631 Long term (current) use of antimetabolite agent: Secondary | ICD-10-CM | POA: Insufficient documentation

## 2024-10-17 DIAGNOSIS — K7460 Unspecified cirrhosis of liver: Secondary | ICD-10-CM | POA: Diagnosis not present

## 2024-10-17 DIAGNOSIS — K754 Autoimmune hepatitis: Secondary | ICD-10-CM | POA: Insufficient documentation

## 2024-10-17 DIAGNOSIS — I8510 Secondary esophageal varices without bleeding: Secondary | ICD-10-CM | POA: Diagnosis not present

## 2024-10-17 DIAGNOSIS — Z79624 Long term (current) use of inhibitors of nucleotide synthesis: Secondary | ICD-10-CM | POA: Insufficient documentation

## 2024-10-17 DIAGNOSIS — Z7952 Long term (current) use of systemic steroids: Secondary | ICD-10-CM | POA: Insufficient documentation

## 2024-10-17 DIAGNOSIS — D61818 Other pancytopenia: Secondary | ICD-10-CM

## 2024-10-17 LAB — COMPREHENSIVE METABOLIC PANEL WITH GFR
ALT: 49 U/L — ABNORMAL HIGH (ref 0–44)
AST: 55 U/L — ABNORMAL HIGH (ref 15–41)
Albumin: 4 g/dL (ref 3.5–5.0)
Alkaline Phosphatase: 84 U/L (ref 38–126)
Anion gap: 8 (ref 5–15)
BUN: 7 mg/dL (ref 6–20)
CO2: 23 mmol/L (ref 22–32)
Calcium: 8.7 mg/dL — ABNORMAL LOW (ref 8.9–10.3)
Chloride: 107 mmol/L (ref 98–111)
Creatinine, Ser: 0.44 mg/dL (ref 0.44–1.00)
GFR, Estimated: >60 mL/min (ref >60)
Glucose, Bld: 146 mg/dL — ABNORMAL HIGH (ref 70–99)
Potassium: 4.3 mmol/L (ref 3.5–5.1)
Sodium: 138 mmol/L (ref 135–145)
Total Bilirubin: 2.5 mg/dL — ABNORMAL HIGH (ref 0.0–1.2)
Total Protein: 6.7 g/dL (ref 6.5–8.1)

## 2024-10-17 LAB — CBC WITH DIFFERENTIAL/PLATELET
Abs Immature Granulocytes: 0.01 K/uL (ref 0.00–0.07)
Basophils Absolute: 0 K/uL (ref 0.0–0.1)
Basophils Relative: 0 %
Eosinophils Absolute: 0.1 K/uL (ref 0.0–0.5)
Eosinophils Relative: 7 %
HCT: 33.5 % — ABNORMAL LOW (ref 36.0–46.0)
Hemoglobin: 12.1 g/dL (ref 12.0–15.0)
Immature Granulocytes: 1 %
Lymphocytes Relative: 18 %
Lymphs Abs: 0.2 K/uL — ABNORMAL LOW (ref 0.7–4.0)
MCH: 40.2 pg — ABNORMAL HIGH (ref 26.0–34.0)
MCHC: 36.1 g/dL — ABNORMAL HIGH (ref 30.0–36.0)
MCV: 111.3 fL — ABNORMAL HIGH (ref 80.0–100.0)
Monocytes Absolute: 0.1 K/uL (ref 0.1–1.0)
Monocytes Relative: 10 %
Neutro Abs: 0.8 K/uL — ABNORMAL LOW (ref 1.7–7.7)
Neutrophils Relative %: 64 %
Platelets: 42 K/uL — ABNORMAL LOW (ref 150–400)
RBC: 3.01 MIL/uL — ABNORMAL LOW (ref 3.87–5.11)
RDW: 14.5 % (ref 11.5–15.5)
WBC: 1.2 K/uL — ABNORMAL LOW (ref 4.0–10.5)
nRBC: 0 % (ref 0.0–0.2)

## 2024-10-17 MED ORDER — PREDNISONE 5 MG PO TABS: 5.0000 mg | ORAL_TABLET | Freq: Every day | ORAL | 1 refills | Status: AC

## 2024-10-17 MED ORDER — AZATHIOPRINE 50 MG PO TABS: 100.0000 mg | ORAL_TABLET | Freq: Every day | ORAL | 1 refills | Status: AC

## 2024-10-17 NOTE — Assessment & Plan Note (Addendum)
 She has intermittent fluctuation of leukopenia and thrombocytopenia,  Her red blood cell count is stable and she is not anemic  she is asymptomatic I suspect the cause of her leukopenia and thrombocytopenia is due to sequestration from splenomegaly and not from bone marrow suppression She tolerated reduced dose of prednisone  well without significant hyperglycemia Right now, the plan will be for her to continue on current dose of prednisone  along with azathioprine  She is currently on current stable dose for more than 6 months I will see her again in 6 months for further follow-up

## 2024-10-17 NOTE — Progress Notes (Signed)
 [facility] OFFICE PROGRESS NOTE  Linda Joesph DEL, PA-C  ASSESSMENT & PLAN:  Assessment & Plan Pancytopenia [facility]) She has intermittent fluctuation of leukopenia and thrombocytopenia,  Her red blood cell count is stable and she is not anemic  she is asymptomatic I suspect the cause of her leukopenia and thrombocytopenia is due to sequestration from splenomegaly and not from bone marrow suppression She tolerated reduced dose of prednisone  well without significant hyperglycemia Right now, the plan will be for her to continue on current dose of prednisone  along with azathioprine  She is currently on current stable dose for more than 6 months I will see her again in 6 months for further follow-up Autoimmune hepatitis (HCC) I have reviewed her multiple investigation from [facility] at care everywhere I reviewed multiple blood work, imaging study and EGD results She will continue follow-up at [facility]    No orders of the defined types were placed in this encounter.   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  The patient was seen due to neutropenic fever Linda [name] was admitted for neutropenic fever She has background history of autoimmune hepatitis and liver cirrhosis She has been on high dose Imuran  for 2 years Her baseline labs from 2017 and 2022 was normal She has very frequent blood count monitoring through the Gi clinic, results are reviewed from Care Everywhere. She has chronic pancytopenia for a very long time and has not need transfusions. She never have infections from pancytopenia She bruises easily The patient denies any recent signs or symptoms of bleeding such as spontaneous epistaxis,  hematuria or hematochezia. She has no menses for last 7 years due to IUD She is not symptomatic from anemia such as dyspnea, chest pain or presyncopal episodes She drinks rarely Last month, due to worsening pancytopenia, she was directed to ER, not admission Imuran  is on hold, resume on 05/18/2023 at half of prior dose of Imuran  of 100 mg daily On 05/29/2023, 40 mg of prednisone  is added On June 28, 2023, the dose of prednisone  is reduced to 10 mg and she continues taking Imuran  at 100 mg On June 26, 2023, MRI of the abdomen and showed   IMPRESSION: 1. 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 There is extensive venous collateralization in the left abdomen with paraesophageal varices evident. 2. Marked splenomegaly with estimated splenic volume of 2024 cc.  3. Small and irregular right hepatic lobe with subtle areas of bandlike T2 hyperintensity deep to foci of capsular retraction. These regions show delayed enhancement on postcontrast imaging, consistent with fibrosis. No focal suspicious enhancing mass lesion. 4. Trace perihepatic and perisplenic ascites. 5. Diffuse edema in the mesentery, right greater than left and retroperitoneal space.  On September 06, 2023, she had CT imaging at [facility]  IMPRESSION: 1.  No active hemorrhage or perihepatic hematoma identified.  2.  Cirrhosis and portal hypertension with varices and small volume ascites.  I have reviewed the past medical history, past surgical history, social history and family history with the patient and they are unchanged from previous note.  She underwent liver biopsy on September 06, 2023 A. LIVER, LEFT, ULTRASOUND-GUIDED RANDOM NEEDLE CORE BIOPSY: LIVER WITH FOCAL MILD PORTAL CHRONIC INFLAMMATION. SEE COMMENT. NO INTERFACE OR  LOBULAR HEPATITIS IS IDENTIFIED. NEGATIVE FOR PATHOLOGIC FIBROSIS.   COMMENT: The history of autoimmune hepatitis status post therapy is noted. No ongoing  hepatocellular damage is appreciated in this sample.  On September 24, 2023, she underwent EGD at [facility] Procedure Date: 09/24/2023 9:51 AM Age: 38 Instrument Name: [id number] MRN: [id number] Procedure: Upper GI endoscopy Indications: Portal hypertension rule out esophageal varices; autoimmune hepatitis on AZA Providers: Nat Deitra Crock, MD Referring MD: [name], MD Medicines: Monitored Anesthesia Care Complications: No immediate complications. Procedure: After obtaining informed consent, the endoscope was passed under direct vision. Throughout the procedure, the patient's blood pressure, pulse, and oxygen saturations were monitored continuously. The Flexible  Endoscope was introduced through the mouth, and advanced to the second part of duodenum. The upper GI endoscopy was accomplished without difficulty. The patient tolerated the procedure well. Findings: A small amount of blood was noted in the posterior oropharynx on scope insertion, likely from reported prior nose bleed, with no visualized mucosa trauma in this area. Grade III varices were found in the mid esophagus and in the distal esophagus. Five bands were successfully placed with complete eradication, resulting in deflation of varices. Portal hypertensive gastropathy was found in the stomach. Diffuse moderate mucosal changes characterized by atrophy, discoloration, erythema, flattening and aphthous ulceration were found in the entire duodenum, question duodenal enteropathy related to AIH(?).  Biopsies were taken with a cold forceps for histology. Estimated Blood Loss: Estimated blood loss was minimal.  Impression: - Small amount of blood in the posterior oropharynx on scope insertion, likely from prior nose bleed and thrombocytopenia (platelest 49K), no visualized mucosal trauma. - Grade III esophageal varices. Completely eradicated. Banded x 5. - Portal hypertensive gastropathy. - Mucosal changes in the duodenum with erosive   changes, atrophy, erythema and flattening. Biopsied. In November 2024, her prednisone  dose is reduced to 5 mg daily and she remain on azathioprine    Lab Results  Component Value Date   VITAMINB12 629 05/04/2023   FERRITIN 28 05/04/2023   HGB 12.1 10/17/2024   RBC 3.01 (L) 10/17/2024   Vitals:   10/17/24 1037  BP: 116/71  Pulse: 80  Resp: 18  Temp: 99.3 F (37.4 C)  SpO2: 98%

## 2024-10-17 NOTE — Assessment & Plan Note (Addendum)
 I have reviewed her multiple investigation from [facility] at care everywhere I reviewed multiple blood work, imaging study and EGD results She will continue follow-up at [facility]

## 2025-04-17 ENCOUNTER — Inpatient Hospital Stay: Admitting: Hematology and Oncology

## 2025-04-17 ENCOUNTER — Inpatient Hospital Stay
